# Patient Record
Sex: Male | Born: 1975 | Race: White | Hispanic: No | Marital: Single | Smoking: Former smoker
Health system: Southern US, Community
[De-identification: ages and names within clinical notes are randomized; demographics above are authoritative.]

## PROBLEM LIST (undated history)

## (undated) VITALS — BP 128/87 | HR 76 | Temp 98.6°F | Resp 17

## (undated) DIAGNOSIS — E785 Hyperlipidemia, unspecified: Secondary | ICD-10-CM

## (undated) DIAGNOSIS — K219 Gastro-esophageal reflux disease without esophagitis: Secondary | ICD-10-CM

## (undated) DIAGNOSIS — F319 Bipolar disorder, unspecified: Secondary | ICD-10-CM

## (undated) HISTORY — DX: Gastro-esophageal reflux disease without esophagitis: K21.9

## (undated) HISTORY — DX: Hyperlipidemia, unspecified: E78.5

## (undated) HISTORY — PX: KNEE SURGERY: SHX244

---

## 1999-03-19 ENCOUNTER — Ambulatory Visit (HOSPITAL_COMMUNITY): Admission: RE | Admit: 1999-03-19 | Discharge: 1999-03-19 | Payer: Self-pay | Admitting: Psychiatry

## 1999-06-12 ENCOUNTER — Emergency Department (HOSPITAL_COMMUNITY): Admission: EM | Admit: 1999-06-12 | Discharge: 1999-06-12 | Payer: Self-pay | Admitting: Emergency Medicine

## 1999-07-18 ENCOUNTER — Ambulatory Visit (HOSPITAL_COMMUNITY): Admission: RE | Admit: 1999-07-18 | Discharge: 1999-07-18 | Payer: Self-pay | Admitting: Psychiatry

## 1999-08-21 ENCOUNTER — Ambulatory Visit (HOSPITAL_COMMUNITY): Admission: RE | Admit: 1999-08-21 | Discharge: 1999-08-21 | Payer: Self-pay | Admitting: Psychiatry

## 2000-12-29 ENCOUNTER — Inpatient Hospital Stay (HOSPITAL_COMMUNITY): Admission: AD | Admit: 2000-12-29 | Discharge: 2001-01-06 | Payer: Self-pay | Admitting: *Deleted

## 2001-01-07 ENCOUNTER — Other Ambulatory Visit (HOSPITAL_COMMUNITY): Admission: RE | Admit: 2001-01-07 | Discharge: 2001-01-21 | Payer: Self-pay | Admitting: Psychiatry

## 2001-09-01 ENCOUNTER — Encounter: Admission: RE | Admit: 2001-09-01 | Discharge: 2001-09-01 | Payer: Self-pay | Admitting: Psychiatry

## 2001-11-09 ENCOUNTER — Encounter: Admission: RE | Admit: 2001-11-09 | Discharge: 2001-11-09 | Payer: Self-pay | Admitting: Psychiatry

## 2002-02-08 ENCOUNTER — Encounter: Admission: RE | Admit: 2002-02-08 | Discharge: 2002-02-08 | Payer: Self-pay | Admitting: Psychiatry

## 2002-05-20 ENCOUNTER — Encounter: Admission: RE | Admit: 2002-05-20 | Discharge: 2002-05-20 | Payer: Self-pay | Admitting: Psychiatry

## 2002-07-02 ENCOUNTER — Encounter (INDEPENDENT_AMBULATORY_CARE_PROVIDER_SITE_OTHER): Payer: Self-pay | Admitting: *Deleted

## 2002-07-02 ENCOUNTER — Ambulatory Visit (HOSPITAL_BASED_OUTPATIENT_CLINIC_OR_DEPARTMENT_OTHER): Admission: RE | Admit: 2002-07-02 | Discharge: 2002-07-02 | Payer: Self-pay | Admitting: General Surgery

## 2002-08-10 ENCOUNTER — Encounter: Admission: RE | Admit: 2002-08-10 | Discharge: 2002-08-10 | Payer: Self-pay | Admitting: Psychiatry

## 2002-11-10 ENCOUNTER — Encounter: Admission: RE | Admit: 2002-11-10 | Discharge: 2002-11-10 | Payer: Self-pay | Admitting: Psychiatry

## 2003-02-09 ENCOUNTER — Encounter: Admission: RE | Admit: 2003-02-09 | Discharge: 2003-02-09 | Payer: Self-pay | Admitting: Psychiatry

## 2004-07-02 ENCOUNTER — Inpatient Hospital Stay (HOSPITAL_COMMUNITY): Admission: RE | Admit: 2004-07-02 | Discharge: 2004-07-09 | Payer: Self-pay | Admitting: Psychiatry

## 2004-07-02 ENCOUNTER — Ambulatory Visit: Payer: Self-pay | Admitting: Psychiatry

## 2004-07-10 ENCOUNTER — Other Ambulatory Visit (HOSPITAL_COMMUNITY): Admission: RE | Admit: 2004-07-10 | Discharge: 2004-08-03 | Payer: Self-pay | Admitting: Psychiatry

## 2004-08-03 ENCOUNTER — Ambulatory Visit (HOSPITAL_COMMUNITY): Payer: Self-pay | Admitting: Psychiatry

## 2004-10-31 ENCOUNTER — Ambulatory Visit (HOSPITAL_COMMUNITY): Payer: Self-pay | Admitting: Psychiatry

## 2005-01-28 ENCOUNTER — Ambulatory Visit (HOSPITAL_COMMUNITY): Payer: Self-pay | Admitting: Psychiatry

## 2005-03-07 ENCOUNTER — Ambulatory Visit: Payer: Self-pay | Admitting: Psychiatry

## 2005-03-07 ENCOUNTER — Inpatient Hospital Stay (HOSPITAL_COMMUNITY): Admission: RE | Admit: 2005-03-07 | Discharge: 2005-03-13 | Payer: Self-pay | Admitting: Psychiatry

## 2005-03-19 ENCOUNTER — Ambulatory Visit (HOSPITAL_COMMUNITY): Payer: Self-pay | Admitting: Psychiatry

## 2005-04-22 ENCOUNTER — Ambulatory Visit (HOSPITAL_COMMUNITY): Payer: Self-pay | Admitting: Psychiatry

## 2005-05-14 ENCOUNTER — Ambulatory Visit: Payer: Self-pay | Admitting: Psychiatry

## 2005-05-14 ENCOUNTER — Inpatient Hospital Stay (HOSPITAL_COMMUNITY): Admission: RE | Admit: 2005-05-14 | Discharge: 2005-05-23 | Payer: Self-pay | Admitting: Psychiatry

## 2005-05-29 ENCOUNTER — Ambulatory Visit (HOSPITAL_COMMUNITY): Payer: Self-pay | Admitting: Psychiatry

## 2005-07-01 ENCOUNTER — Ambulatory Visit (HOSPITAL_COMMUNITY): Payer: Self-pay | Admitting: Psychiatry

## 2005-07-29 ENCOUNTER — Ambulatory Visit (HOSPITAL_COMMUNITY): Payer: Self-pay | Admitting: Psychiatry

## 2005-09-16 ENCOUNTER — Ambulatory Visit (HOSPITAL_COMMUNITY): Payer: Self-pay | Admitting: Psychiatry

## 2005-09-30 ENCOUNTER — Ambulatory Visit (HOSPITAL_COMMUNITY): Payer: Self-pay | Admitting: Psychiatry

## 2005-10-23 ENCOUNTER — Ambulatory Visit (HOSPITAL_COMMUNITY): Payer: Self-pay | Admitting: Psychiatry

## 2005-12-04 ENCOUNTER — Ambulatory Visit (HOSPITAL_COMMUNITY): Payer: Self-pay | Admitting: Psychiatry

## 2006-01-13 ENCOUNTER — Ambulatory Visit (HOSPITAL_COMMUNITY): Payer: Self-pay | Admitting: Psychiatry

## 2006-02-12 ENCOUNTER — Ambulatory Visit (HOSPITAL_COMMUNITY): Payer: Self-pay | Admitting: Psychiatry

## 2006-03-12 ENCOUNTER — Ambulatory Visit (HOSPITAL_COMMUNITY): Payer: Self-pay | Admitting: Psychiatry

## 2006-05-09 ENCOUNTER — Ambulatory Visit (HOSPITAL_COMMUNITY): Payer: Self-pay | Admitting: Psychiatry

## 2006-05-21 ENCOUNTER — Ambulatory Visit (HOSPITAL_COMMUNITY): Admission: RE | Admit: 2006-05-21 | Discharge: 2006-05-21 | Payer: Self-pay | Admitting: Psychiatry

## 2006-07-14 ENCOUNTER — Ambulatory Visit (HOSPITAL_COMMUNITY): Payer: Self-pay | Admitting: Psychiatry

## 2006-08-24 ENCOUNTER — Emergency Department (HOSPITAL_COMMUNITY): Admission: EM | Admit: 2006-08-24 | Discharge: 2006-08-24 | Payer: Self-pay | Admitting: *Deleted

## 2006-09-17 ENCOUNTER — Ambulatory Visit (HOSPITAL_COMMUNITY): Payer: Self-pay | Admitting: Psychiatry

## 2006-10-15 ENCOUNTER — Ambulatory Visit (HOSPITAL_COMMUNITY): Payer: Self-pay | Admitting: Psychiatry

## 2006-12-16 ENCOUNTER — Ambulatory Visit (HOSPITAL_COMMUNITY): Payer: Self-pay | Admitting: Psychiatry

## 2007-02-17 ENCOUNTER — Ambulatory Visit (HOSPITAL_COMMUNITY): Payer: Self-pay | Admitting: Psychiatry

## 2007-02-19 ENCOUNTER — Emergency Department (HOSPITAL_COMMUNITY): Admission: EM | Admit: 2007-02-19 | Discharge: 2007-02-19 | Payer: Self-pay | Admitting: Emergency Medicine

## 2007-04-14 ENCOUNTER — Ambulatory Visit (HOSPITAL_COMMUNITY): Payer: Self-pay | Admitting: Psychiatry

## 2007-04-20 ENCOUNTER — Ambulatory Visit (HOSPITAL_COMMUNITY): Admission: RE | Admit: 2007-04-20 | Discharge: 2007-04-20 | Payer: Self-pay | Admitting: Physician Assistant

## 2007-06-08 ENCOUNTER — Ambulatory Visit (HOSPITAL_COMMUNITY): Payer: Self-pay | Admitting: Psychiatry

## 2007-07-10 ENCOUNTER — Encounter: Admission: RE | Admit: 2007-07-10 | Discharge: 2007-07-10 | Payer: Self-pay | Admitting: Internal Medicine

## 2007-08-12 ENCOUNTER — Ambulatory Visit (HOSPITAL_COMMUNITY): Payer: Self-pay | Admitting: Psychiatry

## 2007-08-28 ENCOUNTER — Encounter (INDEPENDENT_AMBULATORY_CARE_PROVIDER_SITE_OTHER): Payer: Self-pay | Admitting: *Deleted

## 2007-08-28 ENCOUNTER — Ambulatory Visit (HOSPITAL_COMMUNITY): Admission: RE | Admit: 2007-08-28 | Discharge: 2007-08-28 | Payer: Self-pay | Admitting: *Deleted

## 2007-10-15 ENCOUNTER — Ambulatory Visit (HOSPITAL_COMMUNITY): Payer: Self-pay | Admitting: Psychiatry

## 2007-12-17 ENCOUNTER — Ambulatory Visit (HOSPITAL_COMMUNITY): Payer: Self-pay | Admitting: Psychiatry

## 2008-02-11 ENCOUNTER — Ambulatory Visit (HOSPITAL_COMMUNITY): Payer: Self-pay | Admitting: Psychiatry

## 2008-04-07 ENCOUNTER — Ambulatory Visit (HOSPITAL_COMMUNITY): Payer: Self-pay | Admitting: Psychiatry

## 2008-05-12 ENCOUNTER — Ambulatory Visit (HOSPITAL_COMMUNITY): Payer: Self-pay | Admitting: Psychiatry

## 2008-07-15 ENCOUNTER — Ambulatory Visit (HOSPITAL_COMMUNITY): Payer: Self-pay | Admitting: Psychiatry

## 2008-09-16 ENCOUNTER — Ambulatory Visit (HOSPITAL_COMMUNITY): Payer: Self-pay | Admitting: Psychiatry

## 2008-11-11 ENCOUNTER — Ambulatory Visit (HOSPITAL_COMMUNITY): Admission: RE | Admit: 2008-11-11 | Discharge: 2008-11-11 | Payer: Self-pay | Admitting: *Deleted

## 2008-11-11 ENCOUNTER — Encounter (INDEPENDENT_AMBULATORY_CARE_PROVIDER_SITE_OTHER): Payer: Self-pay | Admitting: *Deleted

## 2008-11-15 ENCOUNTER — Ambulatory Visit (HOSPITAL_COMMUNITY): Payer: Self-pay | Admitting: Psychiatry

## 2009-02-01 ENCOUNTER — Ambulatory Visit (HOSPITAL_COMMUNITY): Payer: Self-pay | Admitting: Psychiatry

## 2009-03-09 ENCOUNTER — Ambulatory Visit (HOSPITAL_COMMUNITY): Payer: Self-pay | Admitting: Psychiatry

## 2009-05-10 ENCOUNTER — Ambulatory Visit (HOSPITAL_COMMUNITY): Payer: Self-pay | Admitting: Psychiatry

## 2009-05-17 ENCOUNTER — Ambulatory Visit (HOSPITAL_COMMUNITY): Payer: Self-pay | Admitting: Psychiatry

## 2009-05-23 ENCOUNTER — Ambulatory Visit (HOSPITAL_COMMUNITY): Payer: Self-pay | Admitting: Psychiatry

## 2009-05-31 ENCOUNTER — Ambulatory Visit (HOSPITAL_COMMUNITY): Payer: Self-pay | Admitting: Psychiatry

## 2009-06-15 ENCOUNTER — Encounter: Admission: RE | Admit: 2009-06-15 | Discharge: 2009-06-15 | Payer: Self-pay | Admitting: Internal Medicine

## 2009-06-27 ENCOUNTER — Ambulatory Visit (HOSPITAL_COMMUNITY): Payer: Self-pay | Admitting: Psychiatry

## 2009-08-02 ENCOUNTER — Ambulatory Visit (HOSPITAL_COMMUNITY): Payer: Self-pay | Admitting: Psychiatry

## 2009-08-15 ENCOUNTER — Ambulatory Visit (HOSPITAL_COMMUNITY): Payer: Self-pay | Admitting: Psychiatry

## 2009-08-28 ENCOUNTER — Encounter (INDEPENDENT_AMBULATORY_CARE_PROVIDER_SITE_OTHER): Payer: Self-pay | Admitting: Orthopedic Surgery

## 2009-08-28 ENCOUNTER — Ambulatory Visit: Admission: RE | Admit: 2009-08-28 | Discharge: 2009-08-28 | Payer: Self-pay | Admitting: Orthopedic Surgery

## 2009-08-28 ENCOUNTER — Ambulatory Visit: Payer: Self-pay | Admitting: Vascular Surgery

## 2009-09-12 ENCOUNTER — Ambulatory Visit (HOSPITAL_COMMUNITY): Payer: Self-pay | Admitting: Psychiatry

## 2009-09-15 ENCOUNTER — Encounter: Admission: RE | Admit: 2009-09-15 | Discharge: 2009-09-15 | Payer: Self-pay | Admitting: Internal Medicine

## 2009-09-20 ENCOUNTER — Ambulatory Visit (HOSPITAL_COMMUNITY): Payer: Self-pay | Admitting: Psychiatry

## 2009-10-04 ENCOUNTER — Ambulatory Visit (HOSPITAL_COMMUNITY): Payer: Self-pay | Admitting: Psychiatry

## 2009-10-31 ENCOUNTER — Ambulatory Visit (HOSPITAL_COMMUNITY): Payer: Self-pay | Admitting: Psychiatry

## 2009-11-29 ENCOUNTER — Ambulatory Visit (HOSPITAL_COMMUNITY): Payer: Self-pay | Admitting: Psychiatry

## 2010-01-10 ENCOUNTER — Ambulatory Visit (HOSPITAL_COMMUNITY): Payer: Self-pay | Admitting: Psychiatry

## 2010-04-10 ENCOUNTER — Ambulatory Visit (HOSPITAL_COMMUNITY): Payer: Self-pay | Admitting: Psychiatry

## 2010-04-16 ENCOUNTER — Emergency Department (HOSPITAL_COMMUNITY): Admission: EM | Admit: 2010-04-16 | Discharge: 2010-04-16 | Payer: Self-pay | Admitting: Emergency Medicine

## 2010-05-02 ENCOUNTER — Ambulatory Visit (HOSPITAL_COMMUNITY): Payer: Self-pay | Admitting: Psychiatry

## 2010-05-18 ENCOUNTER — Ambulatory Visit (HOSPITAL_COMMUNITY): Payer: Self-pay | Admitting: Psychiatry

## 2010-06-08 ENCOUNTER — Ambulatory Visit (HOSPITAL_COMMUNITY): Payer: Self-pay | Admitting: Psychiatry

## 2010-07-24 ENCOUNTER — Ambulatory Visit (HOSPITAL_COMMUNITY): Payer: Self-pay | Admitting: Psychiatry

## 2010-09-25 ENCOUNTER — Ambulatory Visit (HOSPITAL_COMMUNITY): Payer: Self-pay | Admitting: Psychiatry

## 2010-12-07 ENCOUNTER — Encounter (HOSPITAL_COMMUNITY): Payer: MEDICARE | Admitting: Physician Assistant

## 2010-12-07 ENCOUNTER — Ambulatory Visit (HOSPITAL_COMMUNITY): Admit: 2010-12-07 | Payer: Self-pay | Admitting: Psychiatry

## 2010-12-07 DIAGNOSIS — F319 Bipolar disorder, unspecified: Secondary | ICD-10-CM

## 2011-01-21 LAB — RAPID URINE DRUG SCREEN, HOSP PERFORMED
Amphetamines: NOT DETECTED
Opiates: NOT DETECTED

## 2011-01-21 LAB — URINALYSIS, ROUTINE W REFLEX MICROSCOPIC
Bilirubin Urine: NEGATIVE
Protein, ur: NEGATIVE mg/dL
Specific Gravity, Urine: 1.005 (ref 1.005–1.030)
pH: 5.5 (ref 5.0–8.0)

## 2011-01-21 LAB — DIFFERENTIAL
Basophils Absolute: 0.1 10*3/uL (ref 0.0–0.1)
Basophils Relative: 2 % — ABNORMAL HIGH (ref 0–1)
Eosinophils Absolute: 0.2 10*3/uL (ref 0.0–0.7)
Eosinophils Relative: 3 % (ref 0–5)
Lymphocytes Relative: 26 % (ref 12–46)
Lymphs Abs: 1.9 10*3/uL (ref 0.7–4.0)
Monocytes Relative: 4 % (ref 3–12)
Neutrophils Relative %: 66 % (ref 43–77)

## 2011-01-21 LAB — CBC
Hemoglobin: 14.4 g/dL (ref 13.0–17.0)
MCHC: 33.5 g/dL (ref 30.0–36.0)
MCV: 88.8 fL (ref 78.0–100.0)
RBC: 4.82 MIL/uL (ref 4.22–5.81)
RDW: 14 % (ref 11.5–15.5)

## 2011-01-21 LAB — BASIC METABOLIC PANEL
Calcium: 9.5 mg/dL (ref 8.4–10.5)
GFR calc Af Amer: >60 mL/min (ref >60)
Glucose, Bld: 121 mg/dL — ABNORMAL HIGH (ref 70–99)
Potassium: 3.8 mEq/L (ref 3.5–5.1)
Sodium: 139 mEq/L (ref 135–145)

## 2011-01-21 LAB — ETHANOL: Alcohol, Ethyl (B): <5 mg/dL (ref 0–10)

## 2011-01-21 LAB — ACETAMINOPHEN LEVEL: Acetaminophen (Tylenol), Serum: <10 ug/mL — ABNORMAL LOW (ref 10–30)

## 2011-02-06 ENCOUNTER — Encounter (HOSPITAL_COMMUNITY): Payer: Medicare Other | Admitting: Physician Assistant

## 2011-02-06 DIAGNOSIS — F319 Bipolar disorder, unspecified: Secondary | ICD-10-CM

## 2011-03-19 NOTE — Op Note (Signed)
NAMENELS, MUNN             ACCOUNT NO.:  [id number]   MEDICAL RECORD NO.:  [id number]          PATIENT TYPE:  AMB   LOCATION:  ENDO                         FACILITY:  [facility]   PHYSICIAN:  Georgiana Spinner, M.D.    DATE OF BIRTH:  [birth year]   DATE OF PROCEDURE:  DATE OF DISCHARGE:                               OPERATIVE REPORT   PROCEDURE:  Upper endoscopy with biopsy.   INDICATIONS:  GERD, rule out Barrett's esophagus.   ANESTHESIA:  Fentanyl 75 mcg, Versed 7.5 mg.   DESCRIPTION OF PROCEDURE:  With the patient mildly sedated in the left  lateral decubitus position, the Pentax videoscopic endoscope was  inserted in the mouth and passed under direct vision through the  esophagus which appeared normal, except there were two areas of possible  Barrett's esophagus.  I could not clearly see the squamocolumnar  junction outline fully, but I elected to biopsy these areas.  Once  accomplished, the endoscope was advanced in the stomach.  The fundus,  body, antrum, duodenal bulb and the second portion of the duodenum were  visualized.  From this point, the endoscope was slowly withdrawn taking  circumferential views of duodenal mucosa until the endoscope had been  pulled back into stomach and placed in retroflexion to view the stomach  from below.  The endoscope was straightened and withdrawn taking  circumferential views of the remaining gastric and esophageal mucosa.  The patient's vital signs and pulse oximeter remained stable.  The  patient tolerated the procedure well without apparent complications.   FINDINGS:  Two possible areas of Barrett's esophagus, rule out normal  variant.  Await biopsy report.  The patient will call me for results and  follow-up with me as an outpatient.           ______________________________  Georgiana Spinner, M.D.     GMO/MEDQ  D:  11/11/2008  T:  11/11/2008  Job:  [id number]

## 2011-03-19 NOTE — Op Note (Signed)
NAMEAMMAR, MOFFATT             ACCOUNT NO.:  [id number]   MEDICAL RECORD NO.:  [id number]          PATIENT TYPE:  AMB   LOCATION:  ENDO                         FACILITY:  [facility]   PHYSICIAN:  Georgiana Spinner, M.D.    DATE OF BIRTH:  27-Oct-1976   DATE OF PROCEDURE:  08/28/2007  DATE OF DISCHARGE:                               OPERATIVE REPORT   PROCEDURE:  Upper endoscopy with biopsy.   ENDOSCOPIST:  Georgiana Spinner, M.D.   INDICATIONS:  Dysphagia with negative barium swallow.   ANESTHESIA:  Fentanyl 75 mcg, Versed 10 mg, Phenergan 25 mg.   PROCEDURE:  With the patient mildly sedated in the left lateral  decubitus position, the Pentax videoscopic endoscope was inserted in the  mouth and passed under direct vision through the esophagus, which  appeared normal, until we reached the distal esophagus, and there  appeared to be changes of Barrett's esophagus and photographed.  We then  entered into the stomach; fundus, body, antrum, duodenal bulb, second  portion duodenum were visualized, although we did not get good  insufflation.  No abnormalities were noted.  From this point, the  endoscope was slowly withdrawn, taking circumferential views of duodenal  mucosa, until the endoscope had been pulled back into the stomach and  placed in retroflexion to view the stomach from below.  The endoscope  was then straightened and withdrawn, taking circumferential views of the  remaining gastric and esophageal mucosa, stopping in the distal  esophagus to biopsy what appeared to be Barrett's esophagus.  The  endoscope was withdrawn.  The patient's vital signs and pulse oximetry  remained stable.  The patient tolerated procedure well without apparent  complications.   FINDINGS:  Question of Barrett's esophagus, biopsied, await biopsy  report.  The patient will call me for results and follow up with me as  an outpatient.           ______________________________  Georgiana Spinner, M.D.     GMO/MEDQ  D:  08/28/2007  T:  08/29/2007  Job:  [id number]

## 2011-03-22 NOTE — Discharge Summary (Signed)
Thomas Woodward, Thomas Woodward             ACCOUNT NO.:  [id number]   MEDICAL RECORD NO.:  [id number]          PATIENT TYPE:  IPS   LOCATION:  0506                          [facility]   PHYSICIAN:  Jeanice Lim, M.D. DATE OF BIRTH:  08/31/1976   DATE OF ADMISSION:  07/02/2004  DATE OF DISCHARGE:  07/09/2004                                 DISCHARGE SUMMARY   IDENTIFYING DATA:  This is a 35 year old, single, Caucasian male voluntarily  admitted with a history of bipolar disorder versus schizoaffective disorder.  Relapsed on and off alcohol a couple of times during the past year.  Currently been drinking alcohol for the last two weeks, restarting work at  [employer].  Feeling nervous and overwhelmed.  Felt the staff was looking at  him and judging him.  Had been taking his brother's Klonopin.  Last drink  was Friday night.  He was drinking and had thoughts about possibly going to  a hotel room and taking all of his pills, which had recently been filled.   PAST PSYCHIATRIC HISTORY:  Three prior psych hospitalizations, the last one  being in February 2002 with a Risperdal overdose, admitted to [facility].  History of several other hospitalizations in 1994 and 1995.  Two weeks  January 2002 at [facility].  History of schizoaffective disorder again.  Treated by Carolanne Grumbling, Psychiatrist, with one episode of manic behavior.  Becoming hyperreligious in the past.   SUBSTANCE ABUSE HISTORY:  Noted above.  Relapsed two weeks.  Again, on  alcohol and occasional marijuana use and had been using his brother's  Klonopin.   PHYSICAL EXAMINATION:  Physical and neurologic exams essentially within  normal limits.   LABORATORY DATA:  Routine admission labs essentially within normal limits,  including CMET and CBC.  Urine drug screen negative for THC and  benzodiazepines, but positive for tricyclics.   CURRENT MEDICATIONS:  Lithium and Lexapro.  Previously been on Abilify.   MENTAL STATUS  EXAM:  Fully alert male, guarded, cooperative.  CIWA is  negative.  Speech hesitant, normal tone.  Mood guarded, depressed.  Poor  insight, obsessive thinking.  Some paranoia with perceptions at work,  suicidal thoughts with thoughts of overdosing.  Cognitively intact.  Judgment, insight limited with impulse control guarded.   ADMITTING DIAGNOSES:   AXIS I:  1.  Rule out schizoaffective disorder, bipolar type.  2.  Alcohol dependence.  3.  Benzodiazepine dependence.  4.  Cannabis abuse by history.   AXIS II:  Deferred.   AXIS III:  None.   AXIS IV:  Moderate - Job and social stress.   AXIS V:  30/58 to 60.   HOSPITAL COURSE:  The patient was admitted and ordered routine p.r.n.  medications and underwent further monitoring.  Was encouraged to participate  in individual, group and milieu therapies.  The patient was monitored for  safety and placed on low dose Librium for safe detox with close monitoring.  Hemoglobin A1c, lipid panel and other medical labs, including lithium level  are ordered as indicated and the patient was adjusted on Seroquel.  Abilify  was restarted due to the patient's history of reporting a positive response  to this and Seroquel optimized to restore sleep.  Patient reported positive  response to medication changes.  Was discharged in improved condition.  Mood  was more stable and euthymic.  Affect brighter.  Thought process was goal  directed.  No psychotic symptoms.  No dangerous ideation, reporting  motivation to be compliant with aftercare plan as well as no acute  withdrawal symptoms.  Reporting motivation to be abstinent.   DISCHARGE MEDICATIONS:  The patient was discharged in improved condition,  given medication education and discharged on:  1.  Lithium carbonate at 300 mg four at bedtime.  2.  Abilify 15 mg a day.  3.  Seroquel 150 mg 8:00 p.m.  4.  Trazodone 100 mg q.h.s. p.r.n.  5.  Lexapro 10 mg.  6.  Antabuse 250 mg one half in the morning  for two days and then one in the      morning.  Avoid alcohol-containing products.  The patient was given      information on Antabuse including potential lethal reaction mixing      alcohol and Antabuse.   FOLLOW UP:  The patient was to follow up with IOP program for dual diagnosis  on the day following discharge.   DISCHARGE DIAGNOSES:   AXIS I:  1.  Rule out schizoaffective disorder, bipolar type.  2.  Alcohol dependence.  3.  Benzodiazepine dependence.  4.  Cannabis abuse by history.   AXIS II:  Deferred.   AXIS III:  None.   AXIS IV:  Moderate - Job and social stress.   AXIS V:  Global Assessment of Functioning on discharge was 55.     Jame   JEM/MEDQ  D:  08/30/2004  T:  08/30/2004  Job:  [id number]

## 2011-03-22 NOTE — Discharge Summary (Signed)
[name], Woodward             ACCOUNT NO.:  [id number]   MEDICAL RECORD NO.:  [id number]          PATIENT TYPE:  IPS   LOCATION:  0305                          [facility]ACILITY:  [facility]   PHYSICIAN:  Geoffery Lyons, M.D.      DATE OF BIRTH:  1976-08-14   DATE OF ADMISSION:  03/07/2005  DATE OF DISCHARGE:  03/13/2005                                 DISCHARGE SUMMARY   CHIEF COMPLAINT AND PRESENTING ILLNESS:  This was the second admission to  [facility]  for this 35 year old single white male,  voluntarily admitted.  History as he claims of a nervous breakdown the night  before.  He had been seeing flashlights in the woods, people talking to him,  stating that they are coming to get him.  Reports increased mood swings,  feeling very irritable and impulsive, wondering if he been hearing some  hallucinations.  Claimed that he was on a new level of thinking.  Has been  taking his medications on an infrequent basis and stated that he was  recovering from alcohol and marijuana, with his last use 2 weeks prior to  this admission.   PAST PSYCHIATRIC HISTORY:  Second admission to [facility].  History  of bipolar disorder, prior history of suicide attempts.  Seeing Dr. Carolanne Grumbling on an outpatient basis.   ALCOHOL AND DRUG HISTORY:  Endorsed drinking and some occasional marijuana  use.  Last drink was 2 weeks prior to this admission.   PAST MEDICAL HISTORY:  Noncontributory.   MEDICATIONS:  1.  Lexapro 20 mg per day.  2.  Lithium 1200 mg per day but did admit to noncompliance.   PHYSICAL EXAMINATION:  Performed, failed to show any acute findings.   LABORATORY WORKUP:  Blood chemistries were within normal limits.  TSH 1.394.  Lithium level 0.28.  Drug screen negative for substances of abuse.   MENTAL STATUS EXAM:  Reveals an alert, cooperative male, fair eye contact.  Speech was clear, normal rate, tempo and production.  Mood somewhat anxious,  irritable.  Affect  anxious.  Thought processes logical, coherent and  relevant, some paranoid ideation with questionable hallucinations.  Cognition well preserved.   ADMISSION DIAGNOSES:   AXIS I:  1.  Bipolar disorder with psychotic features.  2.  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   AXIS II:  No diagnosis.   AXIS III:  No diagnosis.   AXIS IV:  Moderate.   AXIS V:  Global assessment of function upon admission 25-30, highest global  assessment of function in past year 60-65.   COURSE IN HOSPITAL:  He was admitted and started on individual and group  psychotherapy.  He was placed back on lithium 300 twice a day that was  increased to 300 in the morning, 600 at bedtime.  He was placed on Risperdal  0.25 in the morning and 0.5 at night that was increased to 0.5 twice a day  and 1 mg at night.  He was given Cogentin 1 mg every 6 hours as needed.  The  Risperdal was later increased to 0.5 twice  a day and 1.5 at bedtime.  Finally, the lithium was increased to 600 mg twice a day.  He did endorse  that he got very upset when he heard the voices and saw the lights.  Also  heard his name being called.  After that he said he had a nervous breakdown,  unable to sleep, more agitated, feeling unsafe, out of control, very upset  with the situation, claimed compliance with medications but he had been  questioned before.  On initial evaluation he was pretty disorganized with  some paranoid ideation, some worry, some concern.  As the medications were  adjusted and he settled down, he started sleeping better.  He was definitely  less anxious and several times expressed the concern of being afraid of  losing control.  He worked on Pharmacologist, Optician, dispensing and anger  management.  Initially stated that he wanted to go to a residential  treatment program as he wanted to be sure that he would not relapse.  He  felt that he could not handle it if he was discharged from the hospital and  back into  his home and outpatient treatment.  He was somewhat more  insightful.  He was able to live with some events from his past, endorsed  having a lot of losses, and he was able to deal with one in group therapy.  Did endorse some worry, lots of concern.  There was a family session with  his parents.  On May 9 he did endorse that he was feeling better and he did  admit to noncompliance with medications.  Also had some alcohol abuse.  In  the session with the parents, he was very anxious, difficulty sitting still.  He endorsed that he was more clear in terms of the need to take medications  and avoid alcohol.  He was not wanting to pursue the residential treatment  program and felt comfortable going home and coming back to the [facility] IOP  program, so on May 10 he was in full contact with reality.  Endorsed he was  feeling better objectively.  Mood was better, his affect was brighter.  Had  worked on relapse prevention, coping skills.  He was more insightful,  willing to comply with treatment, so we went ahead and discharged to  outpatient followup.   DISCHARGE DIAGNOSES:   AXIS I:  1.  Bipolar disorder with psychotic features.  2.  Alcohol and marijuana abuse.   AXIS II:  No diagnosis.   AXIS III:  No diagnosis.   AXIS IV:  Moderate.   AXIS V:  Global assessment of function upon discharge 50-55.   DISCHARGE MEDICATIONS:  1.  Risperdal 1 mg 3 times a day and 2 at bedtime.  2.  Lithium carbonate 300 two twice a day.   DISPOSITION:  Follow up [facility] IOP and Dr. Ladona Ridgel at [facility].       IL/MEDQ  D:  04/03/2005  T:  04/03/2005  Job:  [id number]

## 2011-03-22 NOTE — H&P (Signed)
Thomas Woodward, Thomas Woodward                         ACCOUNT NO.:  [id number]   MEDICAL RECORD NO.:  [id number]                   PATIENT TYPE:  IPS   LOCATION:  0303                                 FACILITY:  [facility]   PHYSICIAN:  Jeanice Lim, M.D.              DATE OF BIRTH:  1976-06-27   DATE OF ADMISSION:  07/02/2004  DATE OF DISCHARGE:                         PSYCHIATRIC ADMISSION ASSESSMENT   DATE OF ASSESSMENT:  July 03, 2004   PATIENT IDENTIFICATION:  This is a 35 year old single white male.  This is a  voluntary admission.   HISTORY OF PRESENT ILLNESS:  This patient with a history of bipolar disorder  and a history of schizoaffective disorder has relapsed on and off on alcohol  a couple of times during the past year.  Currently, he relapsed on alcohol  approximately two weeks ago after restarting work for [employer] where he  was working with the [employer] and he felt that the timing  of the different tasks in his job had become overwhelming for him.  It made  him nervous.  He also had worked there before and he felt that the staff  were looking at him and judging him about why he had come back to work and  why he had left in the first place.  He had felt overwhelmed about the job,  started drinking again, and was also taking two to three tablets of his  brother's Klonopin 1 mg two to three times a week.  He was drinking to get  drunk, drinking most every day approximately seven to eight beers.  His last  drink was last Friday night.  He became frightened because he had been at a  bar and he remembered the police bringing him home but had memory loss about  some of the events that night.  Because he was drinking he had thoughts  about maybe going to a hotel room, drinking, and taking all of his pills,  which had recently been refilled.  This scared him and he felt that it would  be better to come and request hospitalization for his depression and  anxiety.  He  denies any homicidal thought or auditory or visual  hallucinations.  He admits to having quite a bit of anxiety related to his  job and that this was his primary trigger for relapse.  He was not clear  about his sleep pattern or his appetite and generally seems unable to  describe his various symptoms very clearly.   PAST PSYCHIATRIC HISTORY:  The patient has a history of prior psychiatric  admissions with his last one being in late February 2002 after taking a  Risperdal overdose.  He was admitted here at [facility].  He also has a history of several other hospitalizations in 1994 and 1995 at  [facility] and two weeks in January 2002 at [facility].  The patient  has a history of schizoaffective disorder bipolar type with primarily  depressive episodes.  He is able to describe one episode of manic behavior  when he became very hyperreligious.  He is currently followed since age 73  by Carolanne Grumbling, M.D., in the [facility].  Past medications include Abilify, which was discontinued because it was not  helping; Seroquel made him too drowsy; Risperdal had helped, he had not been  on this for quite some time since he had overdosed on it; and he has also  previously been on Zyprexa.   SUBSTANCE ABUSE HISTORY:  Alcohol and drug history is noted above.  The  patient relapsed about two weeks ago on alcohol with his last drink this  past Friday.  He also endorses occasional use of marijuana and use of his  brother's Klonopin as previously noted.   PAST MEDICAL HISTORY:  The patient has been followed in the past by Dr.  Lendell Caprice who is his primary care physician.  Current medical problems: The  patient denies any current medical problems.  Past medical history is  remarkable for a history of eye surgery at age 43 for muscle weakness.  He  wears corrective lenses, which are currently broken.  He has had a ganglion  cyst removed x 3, history of  childhood tonsillectomy.  No history of  seizures.  Some memory loss following the previous drinking episode last  weekend.  He does not take any medications for medical reasons.  He denies  any prior hospitalizations.   CURRENT MEDICATIONS:  1.  Lithium 1200 mg.  He takes all of his lithium at bedtime.  2.  Lexapro 10 mg p.o. q.a.m.   He was previously on Abilify, which he says his psychiatrist recently  discontinued.   PHYSICAL EXAMINATION:  GENERAL:  The patient's full physical examination was  done in the emergency room by [facility] L. Effie Shy, M.D., and was essentially  negative.  On admission to the unit, the patient is 6 feet tall, 208 pounds.  Hygiene is adequate.  Dress and grooming are appropriate.  Motor exam  appears grossly normal.  VITAL SIGNS:  Temperature 98, heart rate 52, respirations 18, blood pressure  117/75.   LABORATORY DATA:  Lithium level 0.57.  Salicylate and acetaminophen levels  are negative.  CBC is within normal limits.  Urine drug screen is negative  for any THC and negative for nay benzodiazepines but positive for tricyclics  which the patient is unable to explain what if any tricyclics he may have  taken.  He is not aware of taking any.  Urinalysis is negative.  His fasting  glucose on CMET is 106; otherwise, CMET is within normal limits.   SOCIAL HISTORY:  The patient has a 10th grade education, has four half  sisters, one half brother, one full biological brother, and one full  biological sister.  He had previously been living at [facility] and when  relapses on alcohol, he goes back to living with his parents.  He describes  some family stress with parental arguing and some dysfunctional  relationships.  He is on an unsupervised probation for previous DUI and he  does not drive.  He is currently on disability because of his bipolar  disorder and has [employer].  He has never married, no children.  He had plans to attend vocational  rehabilitation but that cancelled in favor of  him coming in here for treatment.   FAMILY HISTORY:  Family history is remarkable for a brother with a history  of bipolar disorder, family history of schizoaffective disorder in a  maternal uncle who committed suicide.   MENTAL STATUS EXAM:  This is a fully alert male with a guarded affect.  He  is cooperative.  Hygiene is adequate.  Grooming: Appropriate.  He is  cooperative.  His CIWA was negative at the time of admission is negative  now.  Speech is hesitant with normal tone and pace once he gets started.  Mood is guarded and depressed.  Thought process reveals poor insight, some  obsessive thinking about what people think about him, some clear paranoia in  relation to his perceptions at work; positive for suicidal thoughts with  thoughts of overdosing on medications.  His thinking is a bit inappropriate.  Cognitive: Intact to person, place, and day of the week and time frames.  Intellect is within normal limits.  Insight: Poor.  Impulse control and  judgment: Guarded.   ADMISSION DIAGNOSES:   AXIS I:  1.  Rule out schizoaffective disorder, bipolar type, depressed.  2.  Alcohol abuse, rule out dependence.  3.  Benzodiazepine abuse.  4.  Cannabis abuse by history.   AXIS II:  Deferred.   AXIS III:  No diagnosis.   AXIS IV:  Moderate job and social stressors.   AXIS V:  Current 20, past year 31.   INITIAL PLAN OF CARE:  Plan is to voluntarily admit the patient with q.32m.  checks in place with a goal of a safe detoxification from alcohol and  benzodiazepines.  We will ask the case worker to talk with the family about  their concerns.  Meanwhile, we will check a hemoglobin A1c and a lipid  panel.  Because of his paranoia and his inappropriate thinking, we are going  to add some Zyprexa 5 mg p.o. q.h.s. and place him on a Librium protocol.  He will be assigned to dual diagnosis programming and has been interacting  appropriately  on the unit.  We will also be contacting Carolanne Grumbling, M.D.,  his primary psychiatrist.   ESTIMATED LENGTH OF STAY:  Six days.     Margaret A. Stephannie Peters                   Jeanice Lim, M.D.    MAS/MEDQ  D:  07/03/2004  T:  07/03/2004  Job:  [id number]

## 2011-03-22 NOTE — Discharge Summary (Signed)
Thomas Woodward, Thomas Woodward             ACCOUNT NO.:  [id number]   MEDICAL RECORD NO.:  [id number]          PATIENT TYPE:  IPS   LOCATION:  0306                          FACILITY:  [facility]   PHYSICIAN:  Geoffery Lyons, M.D.      DATE OF BIRTH:  29-Nov-1975   DATE OF ADMISSION:  05/14/2005  DATE OF DISCHARGE:  05/23/2005                                 DISCHARGE SUMMARY   CHIEF COMPLAINT AND PRESENTING ILLNESS:  This was one of several admissions  to [facility]  for this 35 year old single white male,  voluntarily admitted.  History of bipolar disorder, reports 6 weeks of  increased depression about state of his life, feeling his life was going  nowhere, unable to get a meaningful job, no future, uncomfortable in social  relationships, sleeping well at night, was reclusive, staying in bed until  noon each day.  No planned activities, thinking he is ready to die.  Denies  hallucinations and delusions, history of all in the past.   PAST PSYCHIATRIC HISTORY:  Third or fourth time at [facility].  Sees Dr. Lolly Mustache and Merlyn Albert May, has done well on lithium and Risperdal, got  delusional on Lexapro.   ALCOHOL AND DRUG HISTORY:  Denies any active use at this moment, but had  thoughts of relapsing on alcohol.   PAST MEDICAL HISTORY:  Noncontributory.   MEDICATIONS:  Lithium 600 in the morning and 600 at night, Risperdal 4 mg at  night, trazodone 50 at night as needed.   PHYSICAL EXAMINATION:  Performed, failed to show any acute findings.   LABORATORY WORKUP:  CBC:  White blood cells 11.2, hemoglobin 14.5.  Blood  chemistries:  Glucose 147, repeated 103.  Liver enzymes SGOT 17, SGPT 22.  Hemoglobin A1c 5.5.  TSH 1.870.  Lithium level 0.77.   MENTAL STATUS EXAM:  Reveals an alert, cooperative male, blunted affect,  some tearfulness, psychomotor retardation, cooperative.  Speech slow paced,  normal tone.  Mood depressed, affect depressed, thought processes positive  for  suicidal ideation without a plan, hopeless, helpless, wanting to give  up, sees no reason why to continue going on.  Cognition well preserved.   ADMISSION DIAGNOSES:  AXIS I:  Bipolar disorder, depressed, alcohol abuse in  remission.  AXIS II:  No diagnosis.  AXIS III:  No diagnosis.  AXIS IV:  Moderate.  AXIS V:  Upon admission 35, highest global assessment of functioning in the  last year 60-65.   COURSE IN HOSPITAL:  He was admitted and started in individual and group  psychotherapy.  He was given trazodone 50 at night.  He was maintained on  Lithium 600 twice a day and Risperdal 4 mg at night.  He was given Ambien  for sleep.  Eventually Ambien and the Ativan initially ordered were  discontinued and he was started on Wellbutrin XL 150 in the morning.  Trazodone did not work for him so we went back to Ambien 10 at night.  As  lithium level was subtherapeutic, lithium was increased to 600 in the  morning, 300 in the afternoon, and  600 at night.  Risperdal was decreased to  3 mg at night and he was given Cogentin 1 mg twice a day.  Risperdal was  later decreased to 2 mg at night.  He endorsed increased signs and symptoms  of depression, denies he has relapsed.  He was staying with the situation,  worried that his parents were getting older, he is not having a life, he  does not have his own place, relationships, feeling overwhelmed, wanting to  die.  We continued to work with the medications, with coping skills, sense  of hopelessness and helplessness.  He stays in the house, isolates, avoids  interactions.  Has been thinking about killing himself.  He talks to his  brother and to his parents.  Endorses that he will eventually kill himself  if he continues to stay like this.  Continued to endorse depression.  Sleep  was erratic, worried about what to do, a sense of hopelessness and  helplessness.  He was still able to contract for safety, evidencing some  psychomotor retardation.   Thoughts positive for wanting to die.  There was a  family session with the mother and the father.  He was going to [facility].  He was going to create some sort of a structure.  On July 17, it  seemed that he started turning around.  He was experiencing what seemed to  be akathisia so we started to try to modify the Risperdal.  Once we  increased the lithium, lithium level was 1.01, Risperdal was decreased to 3  mg and then to 2.  We were able to decrease it successfully.  Sleep was an  issue.  We went up to Wellbutrin XL 300 mg per day.  There was no evidence  of panic or mania.  On July 20, he endorsed he was better, endorsed no  suicidal ideas, no homicidal ideas, no hallucinations, no delusions, was  tolerating the medication well.  It was clear that he had to change his  lifestyle, had to get involved, avoid his isolation.  Overall, he was  better.  He was insightful, willing and motivated to pursue further  outpatient treatment, so he was discharged to outpatient followup.   DISCHARGE DIAGNOSES:  AXIS I:  Bipolar disorder, depressed, alcohol abuse in  remission.  AXIS II:  No diagnosis.  AXIS III:  No diagnosis.  AXIS IV:  Moderate.  AXIS V:  Upon discharge 50.   DISCHARGE MEDICATIONS:  1.  Cogentin 1 mg in the morning and at night.  2.  Risperdal 2 mg at night.  3.  Lithium 300 two in the morning and three at bedtime.  4.  Wellbutrin XL 300 mg in the morning.  5.  Ambien 10 at night for sleep.   DISPOSITION:  Follow up with Dr. Tonny Bollman May in [facility].      Geoffery Lyons, M.D.  Electronically Signed     IL/MEDQ  D:  06/19/2005  T:  06/20/2005  Job:  [id number]

## 2011-03-22 NOTE — Op Note (Signed)
   [name], [name]                       ACCOUNT NO.:  [id number]   MEDICAL RECORD NO.:  [id number]                   PATIENT TYPE:  AMB   LOCATION:  DSC                                  FACILITY:  [facility]   PHYSICIAN:  Sharlet Salina T. Hoxworth, M.D.          DATE OF BIRTH:  Mar 28, 1976   DATE OF PROCEDURE:  07/02/2002  DATE OF DISCHARGE:                                 OPERATIVE REPORT   PREOPERATIVE DIAGNOSES:  Pilonidal cyst and sinus.   POSTOPERATIVE DIAGNOSES:  Pilonidal cyst and sinus.   PROCEDURE:  Excision of pilonidal cyst and sinus.   SURGEON:  Lorne Skeens. Hoxworth, M.D.   ANESTHESIA:  General endotracheal.   BRIEF HISTORY:  Thomas Woodward is a 35 year old white male with a one year  history of intermittent swelling, discomfort and purulent drainage from his  sacrococcygeal area. He presented to the office and exam reveals fairly  extensive pilonidal cyst and sinus with typical midline pits over the coccyx  and a chronically draining sinus tract more superiorly. After discussion  with the patient and with his parents, we have elected to proceed with  excision. The nature of the procedure, its indications and risks of  bleeding, infection and delayed wound healing were discussed and understood.  Plans were discussed to leave the wound open due to the chronic infection  and extent of the problem. He is now brought to the operating room for this  procedure.   DESCRIPTION OF PROCEDURE:  The patient was brought to the operating room and  general endotracheal anesthesia was induced on the stretcher and he was  carefully rolled and positioned prone. He received preoperative IV  antibiotics. The sacrococcygeal area was shaved and sterilely prepped and  draped. An elliptical excision was planned encompassing the midline pits and  the superior draining sinus tract. An incision was made through the skin and  dissection carried down deeply through the subcutaneous tissue to the  level  of the sacral fascia and the entire cyst and sinus complex removed intact.  The soft tissue was infiltrated with Marcaine with epinephrine. Complete  hemostasis was obtained with cautery. The wound was packed open with moist  saline gauze. Sponge, needle and instrument counts were correct.  Dry  sterile dressings were applied and the patient was taken to the recovery  room in satisfactory condition.                                                Lorne Skeens. Hoxworth, M.D.    Tory Emerald  D:  07/02/2002  T:  07/05/2002  Job:  [id number]
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
## 2011-03-22 NOTE — H&P (Signed)
[facility]  Patient:    CARRY, ORTEZ                   MRN: [id number] Adm. Date:  12/29/00 Attending:  Netta Cedars, M.D. Dictator:   Valinda Hoar, N.P.                   Psychiatric Admission Assessment  IDENTIFYING INFORMATION:  Mr. Matranga is a 35 year old white single male admitted December 29, 2000, on a voluntary basis.  CHIEF COMPLAINT:  "I started to take an overdose, and I took eight to twelve 3 mg Risperdal.  It started out as a suicide attempt."  HISTORY OF PRESENT ILLNESS:  The patient overdosed on eight to twelve Risperdal 3 mg tabs at 1 p.m. yesterday afternoon on December 29, 2000.  He states this was a suicide attempt.  He apparently woke up in the morning, did not have a lot of energy.  He wanted just to sleep and avoid the day.  He stated he wanted to get up in the morning and pray, and it bothered him that he just did not feel like it.  He states he thought about hurting himself about 10-15 minutes, and then started overdosing on the Risperdal.  He became scared and called his psychiatrist, and emergency medical services was called, and he was taken to [facility] Emergency Department for treatment of the overdose.  The patient appears hopeless.  The patient has a hard time articulating how he feels.  He looks depressed, but he says he is more melancholy, but has been worse recently.  Denies suicidal ideation or intent today.  Denies homicidal ideation or intent today.  He has no hallucinations, no paranoia.  He is withdrawn.  He states he averages sleeping 8-10 hours a day; however, he states in addition to the 8-10 hours of sleep, he stays in bed for a total of 12 hours a day.  He states that sometimes he religiously preoccupied, but he feels like he does not pray enough.  The patients appetite apparently has been okay, and he denies any changes in his appetite. The patient states he does have a history of manic  episodes, and he has been diagnosed with a bipolar disorder.  He says he has had more manic episodes than depressed episodes; however, he cannot describe the manic episodes.  PAST PSYCHIATRIC HISTORY:  The patient has had no previous suicide attempts. Dr. Carolanne Grumbling is his psychiatrist at [facility].  He sees him once every one to two weeks.  He last saw him one week ago.  He has had several hospitalization - [facility] in January 2002 for two weeks, [facility] in 1994 and 1995.  This is his first admission to [facility].  PAST MEDICAL HISTORY:  The patients primary care doctor is Dr. Higinio Plan. He last saw him over one year ago.  Medical problems:  He has a nonproductive cough x 1 week.  He states otherwise he is in good health.  OUTPATIENT MEDICATIONS: 1. Risperdal 4-5 mg p.o. h.s. 2. Lithium or Lithobid or Eskalith CR - he apparently combines them since he    has some at home, but basically he is on Lithobid 1200 mg q.d. 3. Celexa 40 mg p.o. q.a.m. 4. Colace 100 mg one p.o. q.a.m.  DRUG ALLERGIES:  No known drug allergies.  SOCIAL HISTORY:  The patient lives with his parents.  He is single, no children.  He completed the 10th grade; states he just could not motivate himself to finish.  He has five sisters, two brothers.  He has applied for social security disability and also Medicaid.  He reports that he does not do much during the day at all.  ALCOHOL AND DRUG HISTORY:  He denies alcohol use.  He states he has a history of alcohol abuse, but he has been sober since June 2000.  Denies substance abuse.  States in the past he has used marijuana and LSD.  He last used in 1995.  He states he is a nonsmoker.  He quit smoking one and one-half years ago.  FAMILY HISTORY:  He has a brother who has a bipolar disorder.  Maternal uncle committed suicide years ago.  PHYSICAL EXAMINATION:  For positive physical findings, please see  physical exam done at [facility] Emergency Department on December 29, 2000.  VITAL SIGNS:  Temperature was 97, pulse 91, respirations 20, blood pressure 135/70.  Weight 194 pounds.  Height 5 feet 11-1/4 inches.  LABORATORY AND X-RAY DATA:  He had an EKG done which was within normal limits.  Salicylate level was below 4.0.  His CBC with diff had a hematocrit slightly decreased at 38.5, platelets increased at 406.  Urinalysis was within normal limits.  _______ was less than 10.0.  Lithium level was 0.90 on December 29, 2000, which was within normal range.  Urine drug screen was negative. I-STAT 8 was within normal limits.  CURRENT MENTAL STATUS EXAM:  A young adult Caucasian male dressed in scrubs. He is cooperative, however, little emotional response from him at all.  He does have good eye contact.  He appears somewhat stiff.  Speech is slow with a low volume.  It is relevant, but he seems to have a prolonged speech latency. Mood appears to be sad.  Affect is extremely flat.  He denies current suicidal ideation and intent, although he did overdose yesterday.  He denies homicidal ideation or intent.  Thought processes:  He is goal directed, denies hallucinations, denies paranoia.  He seems to have trouble articulating and answers to questions might be a little bit guarded.  Possibility of some religious preoccupation.  Cognitive:  He is alert and oriented, of average intelligence.  His cognitive function appears to be intact.  His judgment is poor, insight poor, impulse control poor.  ADMISSION DIAGNOSES: Axis I:    Bipolar disorder, depressed with suicidal ideation. Axis II:   Deferred. Axis III:  Nonproductive cough x 1 week, possibly due to postnasal drip. Axis IV:   Mild related to mental illness. Axis V:    Current Global Assessment of Functioning 35; highest the past year            60.  TREATMENT PLAN AND RECOMMENDATIONS:  Voluntary admission to [facility].  We will check this patient every 15 minutes, maintain his safety.  He is able to contract for safety on the unit.  We will continue  him on his Celexa 40 mg p.o. q.a.m.  Will give him Imodium two tabs p.o. q.6h. for 24 hours p.r.n. for diarrhea.  Restart him on his Lithobid 600 mg p.o. in the morning and 600 mg p.o. h.s.  Colace 100 mg p.o. in the morning.  We are going to add Zyprexa 2.5 mg p.o. h.s. and discontinue his Risperdal for now. Zyprexa 2.5 mg p.o. q.6h. p.r.n. for anxiety.  Also, we are going to do a CMET and  ask for [facility] to fax their physical exam done on December 29, 2000.  The patient is going to attempt to attend groups.  TENTATIVE LENGTH OF STAY:  3 days. DD:  12/30/00 TD:  12/31/00 Job: [id number]

## 2011-04-11 ENCOUNTER — Encounter (HOSPITAL_COMMUNITY): Payer: Medicare Other | Admitting: Physician Assistant

## 2011-04-11 DIAGNOSIS — F319 Bipolar disorder, unspecified: Secondary | ICD-10-CM

## 2011-04-12 ENCOUNTER — Encounter (HOSPITAL_COMMUNITY): Payer: Medicare Other | Admitting: Physician Assistant

## 2011-06-18 ENCOUNTER — Encounter (INDEPENDENT_AMBULATORY_CARE_PROVIDER_SITE_OTHER): Payer: Medicare Other | Admitting: Physician Assistant

## 2011-06-18 DIAGNOSIS — F319 Bipolar disorder, unspecified: Secondary | ICD-10-CM

## 2011-07-24 ENCOUNTER — Encounter (INDEPENDENT_AMBULATORY_CARE_PROVIDER_SITE_OTHER): Payer: 59 | Admitting: Physician Assistant

## 2011-07-24 DIAGNOSIS — F319 Bipolar disorder, unspecified: Secondary | ICD-10-CM

## 2011-08-21 LAB — LITHIUM LEVEL: Lithium Lvl: 0.88

## 2011-09-24 ENCOUNTER — Other Ambulatory Visit (HOSPITAL_COMMUNITY): Payer: Self-pay

## 2011-09-25 ENCOUNTER — Ambulatory Visit (INDEPENDENT_AMBULATORY_CARE_PROVIDER_SITE_OTHER): Payer: 59 | Admitting: Physician Assistant

## 2011-09-25 DIAGNOSIS — F319 Bipolar disorder, unspecified: Secondary | ICD-10-CM

## 2011-09-25 NOTE — Progress Notes (Signed)
   [facility] Follow-up Outpatient Visit  Thomas Woodward [birth year]  Date: 09/25/11    Subjective: Pt reports doing well overall.  Life is about the same.  Brother is still at home and causes occasional problem.  Pt tries to avoid, but gets sucked in sometimes.  Had a relapse on alcohol in late September.  Drank about seven beers, and a bottle of wine.  Picked up a restart chip in AA.  Reports sleep is ok.  Takes about an hour to fall asleep, but sleeps about 7 - 8 hours.  Denies any psychosis.  Some depression and anxiety.  Attributes that to not calling his Sponsor.  There were no vitals filed for this visit.  Mental Status Examination  Appearance: Casual Alert: Yes Attention: good  Cooperative: Yes Eye Contact: Good Speech: Clear and even Psychomotor Activity: Normal Memory/Concentration: Intact Oriented: person, place, time/date and situation Mood: Depressed mildly Affect: Blunted Thought Processes and Associations: Linear Fund of Knowledge: Good Thought Content:  Insight: Fair Judgement: Fair  Diagnosis: Bipolar disorder  Treatment Plan: Continue meds as currently prescribed.  Follow up in 1 - 2 months.  Consider ReVia if alcohol cravings persist.  Dang Mathison, PA

## 2011-11-26 ENCOUNTER — Ambulatory Visit (INDEPENDENT_AMBULATORY_CARE_PROVIDER_SITE_OTHER): Payer: 59 | Admitting: Physician Assistant

## 2011-11-26 DIAGNOSIS — F319 Bipolar disorder, unspecified: Secondary | ICD-10-CM

## 2011-11-26 MED ORDER — MIRTAZAPINE 30 MG PO TABS: 30.0000 mg | ORAL_TABLET | Freq: Every day | ORAL | Status: DC

## 2011-12-23 ENCOUNTER — Other Ambulatory Visit (HOSPITAL_COMMUNITY): Payer: Self-pay | Admitting: Physician Assistant

## 2012-01-19 ENCOUNTER — Other Ambulatory Visit (HOSPITAL_COMMUNITY): Payer: Self-pay | Admitting: Physician Assistant

## 2012-01-19 DIAGNOSIS — F319 Bipolar disorder, unspecified: Secondary | ICD-10-CM

## 2012-01-30 ENCOUNTER — Ambulatory Visit (INDEPENDENT_AMBULATORY_CARE_PROVIDER_SITE_OTHER): Payer: 59 | Admitting: Physician Assistant

## 2012-01-30 DIAGNOSIS — F319 Bipolar disorder, unspecified: Secondary | ICD-10-CM

## 2012-01-30 MED ORDER — HYDROXYZINE HCL 25 MG PO TABS: ORAL_TABLET | ORAL | Status: DC

## 2012-01-30 MED ORDER — BENZTROPINE MESYLATE 1 MG PO TABS: 1.0000 mg | ORAL_TABLET | Freq: Three times a day (TID) | ORAL | Status: DC

## 2012-02-03 NOTE — Progress Notes (Signed)
   [facility] Follow-up Outpatient Visit  Thomas Woodward 09-Oct-1976  Date: 11/26/11   Subjective: Thomas Woodward presents today to follow up on medications prescribed for his bipolar disorder. He feels that all in all he is doing well. He sleeps well on the Remeron, his depression is fairly well managed with the Wellbutrin, and his anxiety seems to be well managed on Zyprexa. He denies any suicidal or homicidal ideation. He denies any auditory or visual hallucinations.  There were no vitals filed for this visit.  Mental Status Examination  Appearance: Casual Alert: Yes Attention: good  Cooperative: Yes Eye Contact: Good Speech: Clear and even Psychomotor Activity: Psychomotor Retardation Memory/Concentration: Intact Oriented: person, place, time/date and situation Mood: Depressed Affect: Blunt and Congruent Thought Processes and Associations: Goal Directed and Logical Fund of Knowledge: Fair Thought Content: Normal Insight: Good Judgement: Good  Diagnosis: Bipolar disorder not otherwise specified  Treatment Plan: We will continue his Zyprexa at 15 mg twice daily, Remeron 30 mg nightly, and Wellbutrin XL 300 mg daily. Will also continue his Cogentin 1 mg twice daily, and he will followup in 2 months.  Angel Weedon, PA-C

## 2012-02-03 NOTE — Progress Notes (Signed)
   [facility] Follow-up Outpatient Visit  Thomas Woodward 11/30/1975  Date: 01/30/2012   Subjective: Thomas Woodward presents today to followup on his medications for bipolar disorder. He continues to report that he is stable. He does report some sensations of needing to move frequently, which he feels may be side effects to the Zyprexa, and wonders if the Cogentin could be increased. He also endorses some difficulty falling to sleep, but once he gets to sleep he stays asleep through the night. He states that it may take an hour or two to fall asleep. He denies any suicidal or homicidal ideation. He denies any auditory or visual hallucinations.  There were no vitals filed for this visit.  Mental Status Examination  Appearance: Casual Alert: Yes Attention: good  Cooperative: Yes Eye Contact: Good Speech: Clear and even Psychomotor Activity: Psychomotor Retardation Memory/Concentration: Intact Oriented: person, place, time/date and situation Mood: Anxious and Depressed Affect: Blunt Thought Processes and Associations: Goal Directed and Linear Fund of Knowledge: Good Thought Content: Normal Insight: Good Judgement: Good  Diagnosis: Bipolar disorder not otherwise specified  Treatment Plan: We will add Vistaril 25 mg one to 2 tablets to be taken at bedtime for sleep, and increased his Cogentin to 1 mg 3 times daily for akathisia. We will continue his Zyprexa 15 mg twice daily Wellbutrin XL 300 mg daily, and possibly decrease or discontinue the Remeron depending on the results from his Vistaril. He will followup in one month.  Brentin Shin, PA-C

## 2012-03-11 ENCOUNTER — Ambulatory Visit (INDEPENDENT_AMBULATORY_CARE_PROVIDER_SITE_OTHER): Payer: 59 | Admitting: Physician Assistant

## 2012-03-11 DIAGNOSIS — F319 Bipolar disorder, unspecified: Secondary | ICD-10-CM

## 2012-03-11 NOTE — Progress Notes (Signed)
   [facility] Behavioral Health Follow-up Outpatient Visit  NICKOLAI RINKS [birth year]  Date: 03/11/2012   Subjective: [name] presents today to followup on his medications prescribed for bipolar disorder. He reports that he stopped taking the Remeron and Vistaril as they had no effect. 2 days ago he had some teeth extracted, and he is now prescribed opiate pain relievers, and reports that he sleeps well with them. Otherwise he reports that he often stays up late and will wait up late in the morning. He continues to [facility] on Mondays and Fridays, and work on Tuesday Wednesday and Thursday he reports that he is currently 7 months sober, but still has occasional cravings. When he does have a craving he tries to think it all away through which is usually sufficient to avoid picking up a drink. He denies any suicidal or homicidal ideation. He denies any auditory or visual hallucinations.  There were no vitals filed for this visit.  Mental Status Examination  Appearance: Well groomed and casually dressed Alert: Yes Attention: good  Cooperative: Yes Eye Contact: Good Speech: Clear and coherent Psychomotor Activity: Normal Memory/Concentration: Intact Oriented: person, place, time/date and situation Mood: Euthymic Affect: Appropriate Thought Processes and Associations: Linear Fund of Knowledge: Good Thought Content: Normal Insight: Good Judgement: Good  Diagnosis: Bipolar disorder  Treatment Plan: We will discontinue the Remeron and Vistaril as they are ineffective. Continue the Zyprexa 15 mg twice daily, Wellbutrin XL 300 mg daily, and Cogentin 1 mg 3 times daily. He will return for followup in 6 weeks. He is encouraged to call if problems arise. At his followup we may consider other medications for insomnia.  Billyjoe Go, PA-C

## 2012-03-26 ENCOUNTER — Other Ambulatory Visit (HOSPITAL_COMMUNITY): Payer: Self-pay | Admitting: *Deleted

## 2012-03-30 ENCOUNTER — Other Ambulatory Visit (HOSPITAL_COMMUNITY): Payer: Self-pay | Admitting: Physician Assistant

## 2012-03-31 MED ORDER — BENZTROPINE MESYLATE 1 MG PO TABS: 1.0000 mg | ORAL_TABLET | Freq: Three times a day (TID) | ORAL | Status: DC

## 2012-04-22 ENCOUNTER — Ambulatory Visit (INDEPENDENT_AMBULATORY_CARE_PROVIDER_SITE_OTHER): Payer: Medicaid Other | Admitting: Physician Assistant

## 2012-04-22 DIAGNOSIS — F319 Bipolar disorder, unspecified: Secondary | ICD-10-CM

## 2012-04-22 NOTE — Progress Notes (Signed)
   [facility] Behavioral Health Follow-up Outpatient Visit  Thomas Woodward 02-12-1976  Date: 04/22/2012   Subjective: Thomas Woodward presents today to followup on his treatment for bipolar disorder. He reports that his mood has been stable. He continues to fair to bed late, around midnight, and must wake up no later than 8 AM today work. He states that long sleep is still difficult, and reports that his brother and his brother's girlfriend are up until early morning hours watching television, and he is able to hear them and light strengthening to his room from where they are. Thomas Woodward continues to work 3 days a week on Tuesdays Wednesdays and Thursdays, and [facility] the [facility] on Mondays and Fridays. He denies any suicidal or homicidal ideation. He denies any auditory or visual hallucinations.  There were no vitals filed for this visit.  Mental Status Examination  Appearance: Well groomed and casually dressed Alert: Yes Attention: good  Cooperative: Yes Eye Contact: Good Speech: Clear and coherent Psychomotor Activity: Psychomotor Retardation Memory/Concentration: Intact Oriented: person, place, time/date and situation Mood: Dysphoric Affect: Blunt Thought Processes and Associations: Goal Directed and Linear Fund of Knowledge: Good Thought Content: Normal Insight: Good Judgement: Good  Diagnosis:  Bipolar disorder  Treatment Plan: We will continue all of his medications as follows: Zyprexa 15 mg twice daily, Wellbutrin XL 300 mg daily, and Cogentin 1 mg 3 times daily. He wishes not to add another medication for sleep at this time, but we will consider amitriptyline in the future if necessary. He has been recommended to purchase ear plugs and eye shades to assist in his ability to sleep. He will return for followup in 2 months.  Thomas Nuttall, PA-C

## 2012-04-28 ENCOUNTER — Other Ambulatory Visit (HOSPITAL_COMMUNITY): Payer: Self-pay | Admitting: Physician Assistant

## 2012-05-19 ENCOUNTER — Other Ambulatory Visit (HOSPITAL_COMMUNITY): Payer: Self-pay | Admitting: Physician Assistant

## 2012-06-15 ENCOUNTER — Ambulatory Visit (INDEPENDENT_AMBULATORY_CARE_PROVIDER_SITE_OTHER): Payer: 59 | Admitting: Physician Assistant

## 2012-06-15 ENCOUNTER — Encounter (HOSPITAL_COMMUNITY): Payer: Self-pay | Admitting: Physician Assistant

## 2012-06-15 DIAGNOSIS — F3160 Bipolar disorder, current episode mixed, unspecified: Secondary | ICD-10-CM

## 2012-06-15 DIAGNOSIS — F319 Bipolar disorder, unspecified: Secondary | ICD-10-CM

## 2012-06-15 NOTE — Progress Notes (Signed)
   [facility] Behavioral Health Follow-up Outpatient Visit  Thomas Woodward April 14, 1976  Date: 06/15/2012   Subjective: Thomas Woodward presents today to followup on his treatment for bipolar disorder. He reports he has been doing fairly well. His mood has been stable. He denies any suicidal or homicidal ideation. He denies any auditory or visual hallucinations, and reports that he has not had any paranoia. His sleep is good, and he gets to bed most nights by midnight. His appetite is good. His home situation is still somewhat difficult, but he is able to tolerate it. He continues to work and attend the [employer] regularly.  There were no vitals filed for this visit.  Mental Status Examination  Appearance: Well groomed and casually dressed Alert: Yes Attention: good  Cooperative: Yes Eye Contact: Good Speech: Clear and coherent Psychomotor Activity: Normal Memory/Concentration: Intact Oriented: person, place, time/date and situation Mood: Dysphoric Affect: Blunt Thought Processes and Associations: Linear Fund of Knowledge: Good Thought Content: Normal Insight: Good Judgement: Good  Diagnosis: Bipolar mixed  Treatment Plan:  Continue Zyprexa 15 mg twice daily, Wellbutrin XL 300 mg daily, and Cogentin 1 mg 3 times daily. Followup in 2 months.   Emmajean Ratledge, PA-C

## 2012-07-28 ENCOUNTER — Other Ambulatory Visit (HOSPITAL_COMMUNITY): Payer: Self-pay | Admitting: Physician Assistant

## 2012-07-28 MED ORDER — BENZTROPINE MESYLATE 1 MG PO TABS: 1.0000 mg | ORAL_TABLET | Freq: Three times a day (TID) | ORAL | Status: DC

## 2012-08-24 ENCOUNTER — Ambulatory Visit (INDEPENDENT_AMBULATORY_CARE_PROVIDER_SITE_OTHER): Payer: 59 | Admitting: Physician Assistant

## 2012-08-24 DIAGNOSIS — F319 Bipolar disorder, unspecified: Secondary | ICD-10-CM

## 2012-08-24 NOTE — Progress Notes (Signed)
   [facility] Behavioral Health Follow-up Outpatient Visit  OLMAN CHATTIN [birth year]  Date: 08/24/2012   Subjective: [name] presents today to followup on his treatment for bipolar disorder. He reports that overall he has been doing well. He does express some increased anxiety secondary to some recent dental work that he has had to get done. He is worried that his Medicaid will not cover all the cost. He also reports that the new Medicare rules will have a quantity limit on his olanzapine to one tablet per day, and he is concerned about being able to continue the medication when that goes into effect. Otherwise his mood has been stable. He continues to live bed around midnight or 1 AM, and has to be at the [facility] at 9 AM. He denies any suicidal or homicidal ideation. He denies any auditory or visual hallucinations.  There were no vitals filed for this visit.  Mental Status Examination  Appearance: Well groomed and casually dressed Alert: Yes Attention: good  Cooperative: Yes Eye Contact: Good Speech: Clear and coherent Psychomotor Activity: Normal Memory/Concentration: Intact Oriented: person, place, time/date and situation Mood: Anxious Affect: Congruent Thought Processes and Associations: Linear Fund of Knowledge: Good Thought Content: Normal Insight: Good Judgement: Good  Diagnosis: Bipolar disorder not otherwise specified  Treatment Plan: We will continue his Zyprexa 15 mg twice daily, Wellbutrin XL 300 mg daily, and Cogentin 1 mg 3 times daily. He will return for followup in 2 months.  Remie Mathison, PA-C

## 2012-09-14 ENCOUNTER — Other Ambulatory Visit (HOSPITAL_COMMUNITY): Payer: Self-pay | Admitting: Physician Assistant

## 2012-09-25 ENCOUNTER — Other Ambulatory Visit (HOSPITAL_COMMUNITY): Payer: Self-pay | Admitting: Physician Assistant

## 2012-10-12 ENCOUNTER — Other Ambulatory Visit (HOSPITAL_COMMUNITY): Payer: Self-pay | Admitting: Physician Assistant

## 2012-10-26 ENCOUNTER — Ambulatory Visit (INDEPENDENT_AMBULATORY_CARE_PROVIDER_SITE_OTHER): Payer: Medicaid Other | Admitting: Physician Assistant

## 2012-10-26 DIAGNOSIS — F319 Bipolar disorder, unspecified: Secondary | ICD-10-CM

## 2012-10-26 DIAGNOSIS — F3130 Bipolar disorder, current episode depressed, mild or moderate severity, unspecified: Secondary | ICD-10-CM

## 2012-12-05 ENCOUNTER — Other Ambulatory Visit (HOSPITAL_COMMUNITY): Payer: Self-pay | Admitting: Physician Assistant

## 2012-12-24 ENCOUNTER — Telehealth (HOSPITAL_COMMUNITY): Payer: Self-pay | Admitting: *Deleted

## 2012-12-24 DIAGNOSIS — F319 Bipolar disorder, unspecified: Secondary | ICD-10-CM

## 2012-12-24 MED ORDER — OLANZAPINE 20 MG PO TABS: 10.0000 mg | ORAL_TABLET | Freq: Two times a day (BID) | ORAL | Status: DC

## 2012-12-24 NOTE — Telephone Encounter (Signed)
Informed pt that provider was informed that insurance did not cover Olazapine at current prescribed dose. Jorje Guild PA ordered Olazapine 20 mg, 1/2 tablet BID.Informed pt of change in dose.  Instructed pt to contact office PRN

## 2012-12-28 ENCOUNTER — Ambulatory Visit (INDEPENDENT_AMBULATORY_CARE_PROVIDER_SITE_OTHER): Payer: 59 | Admitting: Physician Assistant

## 2012-12-28 DIAGNOSIS — F319 Bipolar disorder, unspecified: Secondary | ICD-10-CM

## 2012-12-28 NOTE — Progress Notes (Signed)
   Thomas Woodward  Thomas Woodward May 30, 1976  Date: 12/28/2012   Subjective: Thomas Woodward presents today to followup on history meds for bipolar disorder. He reports that overall everything is going well. He states that he is getting to bed somewhat later than he usually does. He normally goes to bed around 11 or 12, but recently has been staying up to 1:30 or 2. He is going to meetings 2 evenings a week on Thursdays and Sundays, and sometimes goes out with people after the meeting to eat and play chess. He reports that his mood is good overall, but he does have an occasional down day. He denies his having any paranoid delusions. He denies any suicidal or homicidal ideation. He denies any auditory or visual hallucinations.  There were no vitals filed for this Woodward.  Mental Status Examination  Appearance: Casual Alert: Yes Attention: good  Cooperative: Yes Eye Contact: Good Speech: Clear and coherent Psychomotor Activity: Psychomotor Retardation Memory/Concentration: Intact Oriented: person, place, time/date and situation Mood: Dysphoric Affect: Congruent Thought Processes and Associations: Logical Fund of Knowledge: Good Thought Content: Normal Insight: Fair Judgement: Good  Diagnosis: Bipolar disorder not otherwise specified  Treatment Plan: We will continue his Wellbutrin XL 300 mg daily, Cogentin 1 mg 3 times daily, and decrease his Zyprexa to 10 mg twice a day due to the fact that insurance will not pay for his previous dose. He has been instructed to be vigilant for any decompensation in mood or psychotic features. If this occurs we may have to change him to a different antipsychotic medication.  Walid Haig, PA-C

## 2013-01-06 ENCOUNTER — Other Ambulatory Visit (HOSPITAL_COMMUNITY): Payer: Self-pay | Admitting: Physician Assistant

## 2013-01-31 NOTE — Progress Notes (Signed)
   [facility] Behavioral Health Follow-up Outpatient Visit  Thomas Woodward 01-01-1976  Date: 10/26/2012   Subjective: [name] presents today to followup on his treatment for bipolar disorder. He reports that he is experiencing significant amount of stress at home. His brother and his brother's girlfriend argue, and the girlfriend left and left a 38-year-old child in the home. This chaos has affected his mood, and he feels that he is more irritable. He has settled down so since the event no. He states his sleep is okay, but tends stay up late. He admits that he has trouble in getting his day. He [facility] to [facility], and gets about 9 AM. He reports that his appetite is good. He still working at [employer] 3 days a week on Tuesdays Wednesdays and Thursdays. He denies any paranoia. He denies any auditory or visual hallucinations. He endorses some suicidal ideation, but he is able to contract for safety. He denies any homicidal ideation.  There were no vitals filed for this visit.  Mental Status Examination  Appearance: casual Alert: Yes Attention: good  Cooperative: Yes Eye Contact: Good Speech: clear and coherent Psychomotor Activity: Normal Memory/Concentration: intact Oriented: person, place, time/date and situation Mood: Dysphoric Affect: Congruent Thought Processes and Associations: Linear Fund of Knowledge: Good Thought Content: Suicidal ideation Insight: Good Judgement: Good  Diagnosis: bipolar disorder, most recent episode depressed  Treatment Plan: We will continue his Zyprexa 15 mg twice daily, Wellbutrin XL 300 mg daily, and Cogentin 1 mg 3 times daily. He will return for followup in 2 months.   Haydin Dunn, PA-C

## 2013-02-15 ENCOUNTER — Other Ambulatory Visit (HOSPITAL_COMMUNITY): Payer: Self-pay | Admitting: Physician Assistant

## 2013-02-15 DIAGNOSIS — F319 Bipolar disorder, unspecified: Secondary | ICD-10-CM

## 2013-02-22 ENCOUNTER — Encounter (HOSPITAL_COMMUNITY): Payer: Self-pay

## 2013-02-22 ENCOUNTER — Ambulatory Visit (INDEPENDENT_AMBULATORY_CARE_PROVIDER_SITE_OTHER): Payer: 59 | Admitting: Physician Assistant

## 2013-02-22 DIAGNOSIS — F319 Bipolar disorder, unspecified: Secondary | ICD-10-CM

## 2013-02-22 NOTE — Progress Notes (Signed)
   [facility] Behavioral Health Follow-up Outpatient Visit  Thomas Woodward 04-17-1976  Date: 02/22/2013   Subjective: Thomas Woodward presents today to followup on his treatment for bipolar disorder. He reports that his mood has been "pretty good." His brother continues to agitate him at home, which can be bothersome. 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 He continues to have some difficulties in initiating sleep. He denies any suicidal or homicidal ideation. He denies any auditory or visual hallucinations. He endorses some mild paranoia. He feels that he is doing okay on the reduced dose of Zyprexa.  There were no vitals filed for this visit.  Mental Status Examination  Appearance: Casual Alert: Yes Attention: good  Cooperative: Yes Eye Contact: Good Speech: Clear and coherent Psychomotor Activity: Normal Memory/Concentration: Intact Oriented: person, place, time/date and situation Mood: Dysphoric Affect: Congruent Thought Processes and Associations: Linear Fund of Knowledge: Good Thought Content: Normal Insight: Good Judgement: Good  Diagnosis: Bipolar disorder not otherwise specified  Treatment Plan: We will continue his Zyprexa 10 mg twice daily, Wellbutrin XL 300 mg daily, and Cogentin 1 mg 3 times daily. He will return for followup in 2 months. He is encouraged to call between appointments if concerns arise.  Torry Istre, PA-C

## 2013-03-16 ENCOUNTER — Other Ambulatory Visit: Payer: Self-pay | Admitting: Radiology

## 2013-03-16 NOTE — Telephone Encounter (Signed)
Error/ needs to be [employer]

## 2013-03-29 ENCOUNTER — Other Ambulatory Visit (HOSPITAL_COMMUNITY): Payer: Self-pay | Admitting: Physician Assistant

## 2013-04-26 ENCOUNTER — Other Ambulatory Visit (HOSPITAL_COMMUNITY): Payer: Self-pay | Admitting: Physician Assistant

## 2013-04-26 ENCOUNTER — Ambulatory Visit (INDEPENDENT_AMBULATORY_CARE_PROVIDER_SITE_OTHER): Payer: 59 | Admitting: Physician Assistant

## 2013-04-26 ENCOUNTER — Encounter (HOSPITAL_COMMUNITY): Payer: Self-pay | Admitting: Physician Assistant

## 2013-04-26 VITALS — BP 127/74 | HR 75 | Ht 72.0 in | Wt 196.2 lb

## 2013-04-26 DIAGNOSIS — F319 Bipolar disorder, unspecified: Secondary | ICD-10-CM

## 2013-04-26 NOTE — Progress Notes (Signed)
   [facility] Behavioral Health Follow-up Outpatient Visit  Thomas Woodward Nov 11, 1975  Date: 04/26/2013   Subjective: [name] presents today to followup on his treatment for bipolar disorder. When asked how life was, he stated "up and down." He reports that his mood has been "kind of low." He explains that he has set some expectations for his self, and he is disappointed when he doesn't need them. He endorses a significant amount of anxiety, but also states that he is trying to quit smoking. His sleep has improved as he is no longer falling asleep in a chair, or sleeping on top of the bed in his clothes. He is going to bed around midnight, and falls asleep quickly. He states that his thinking is clear, and he is not allowing his imagination to run away with him. He denies any suicidal or homicidal ideation. He denies any auditory or visual hallucinations.  Filed Vitals:   04/26/13 1443  BP: 127/74  Pulse: 75    Mental Status Examination  Appearance: Casual Alert: Yes Attention: good  Cooperative: Yes Eye Contact: Good Speech: Clear and coherent Psychomotor Activity: Normal Memory/Concentration: Intact Oriented: person, place, time/date and situation Mood: Anxious Affect: Flat Thought Processes and Associations: Logical Fund of Knowledge: Fair Thought Content: Normal Insight: Good Judgement: Good  Diagnosis: Bipolar disorder, NOS  Treatment Plan: We will continue his Zyprexa 10 mg twice daily, Wellbutrin XL 300 mg daily, and Cogentin 1 mg 3 times daily. He will return for followup in 2 months.   Kaelei Wheeler, PA-C

## 2013-06-07 ENCOUNTER — Encounter (HOSPITAL_COMMUNITY): Payer: Self-pay | Admitting: Physician Assistant

## 2013-06-07 ENCOUNTER — Ambulatory Visit (INDEPENDENT_AMBULATORY_CARE_PROVIDER_SITE_OTHER): Payer: 59 | Admitting: Physician Assistant

## 2013-06-07 VITALS — BP 125/84 | HR 88 | Ht 72.0 in | Wt 193.4 lb

## 2013-06-07 DIAGNOSIS — F319 Bipolar disorder, unspecified: Secondary | ICD-10-CM

## 2013-06-07 NOTE — Progress Notes (Signed)
   [facility] Behavioral Health Follow-up Outpatient Visit  Thomas Woodward 05-19-1976  Date: 06/07/2013   Subjective: Thomas Woodward presents today to followup on his treatment for bipolar disorder. He reports that he is doing quite well recently. He states that he has begun to attend church again, and has returned to the [employer]. He last attended church in 2002. He feels that his medications are working well. He recently feels that he is thinking more clearly, and is in better touch with reality. He reports that his sleep is good, and states that he goes to bed usually around 12 or 1 AM, then sleeps until 8 AM. He denies that he is experiencing a depression, but states that at times he takes things personally that really have nothing to do with him. He had only reports he quit smoking, and last smoked about 6 weeks ago. He denies any suicidal or homicidal ideation. He denies any auditory or visual hallucinations.  Filed Vitals:   06/07/13 1402  BP: 125/84  Pulse: 88    Mental Status Examination  Appearance: Casual Alert: Yes Attention: good  Cooperative: Yes Eye Contact: Good Speech: Clear and coherent Psychomotor Activity: Normal Memory/Concentration: Intact Oriented: person, place, time/date and situation Mood: Euthymic Affect: Appropriate Thought Processes and Associations: Linear Fund of Knowledge: Good Thought Content: Normal Insight: Good Judgement: Good  Diagnosis: Bipolar disorder not otherwise specified  Treatment Plan: We will continue his Zyprexa 10 mg twice daily, Wellbutrin XL 300 mg daily, and Cogentin 1 mg 3 times daily. He will return for followup in 2 months  Lorene Samaan, PA-C

## 2013-06-29 ENCOUNTER — Other Ambulatory Visit (HOSPITAL_COMMUNITY): Payer: Self-pay | Admitting: Physician Assistant

## 2013-07-11 ENCOUNTER — Other Ambulatory Visit (HOSPITAL_COMMUNITY): Payer: Self-pay | Admitting: Physician Assistant

## 2013-08-16 ENCOUNTER — Encounter (INDEPENDENT_AMBULATORY_CARE_PROVIDER_SITE_OTHER): Payer: Self-pay

## 2013-08-16 ENCOUNTER — Ambulatory Visit (INDEPENDENT_AMBULATORY_CARE_PROVIDER_SITE_OTHER): Payer: 59 | Admitting: Physician Assistant

## 2013-08-16 ENCOUNTER — Encounter (HOSPITAL_COMMUNITY): Payer: Self-pay | Admitting: Physician Assistant

## 2013-08-16 VITALS — BP 112/72 | HR 88 | Ht 70.87 in | Wt 201.0 lb

## 2013-08-16 DIAGNOSIS — F319 Bipolar disorder, unspecified: Secondary | ICD-10-CM

## 2013-08-17 NOTE — Progress Notes (Signed)
   [facility] Behavioral Health Follow-up Outpatient Visit  Thomas Woodward [birth year]  Date:  08/16/2013   Subjective: Thomas Woodward presents today to followup on his treatment for bipolar disorder. He reports that his mood has been "pretty good." His sleep is about the same, that being that he stays up late and then struggles to get up in the morning. He is eating well. He denies any paranoid delusions. He denies any suicidal or homicidal ideation. He denies any auditory or visual hallucinations. He continues to work every Tuesday Wednesday and Thursday, and [facility] the [facility] on Mondays and Fridays. He is also attending church on Sundays, and on some Saturdays he gets involved with church affiliated activities. All in all he is doing very well.  Filed Vitals:   08/16/13 1528  BP: 112/72  Pulse: 88    Mental Status Examination  Appearance: Casual Alert: Yes Attention: good  Cooperative: Yes Eye Contact: Good Speech: Clear and coherent Psychomotor Activity: Normal Memory/Concentration: Intact Oriented: person, place, time/date and situation Mood: Dysphoric Affect: Flat Thought Processes and Associations: Linear Fund of Knowledge: Good Thought Content: Normal Insight: Good Judgement: Good  Diagnosis: Bipolar disorder NOS  Treatment Plan: We will continue his current medications, including Zyprexa 10 mg twice daily, Wellbutrin XL 300 mg daily, and Cogentin 1 mg 3 times daily. He will return for followup in 2 months.  Hadley Soileau, PA-C

## 2013-09-13 ENCOUNTER — Other Ambulatory Visit (HOSPITAL_COMMUNITY): Payer: Self-pay | Admitting: *Deleted

## 2013-09-13 DIAGNOSIS — F319 Bipolar disorder, unspecified: Secondary | ICD-10-CM

## 2013-09-13 MED ORDER — BUPROPION HCL ER (XL) 300 MG PO TB24: ORAL_TABLET | ORAL | Status: DC

## 2013-09-13 MED ORDER — OLANZAPINE 20 MG PO TABS: ORAL_TABLET | ORAL | Status: DC

## 2013-10-18 ENCOUNTER — Ambulatory Visit (HOSPITAL_COMMUNITY): Payer: Self-pay | Admitting: Psychiatry

## 2013-11-03 ENCOUNTER — Encounter (HOSPITAL_COMMUNITY): Payer: Self-pay | Admitting: Psychiatry

## 2013-11-03 ENCOUNTER — Ambulatory Visit (INDEPENDENT_AMBULATORY_CARE_PROVIDER_SITE_OTHER): Payer: 59 | Admitting: Psychiatry

## 2013-11-03 VITALS — BP 132/72 | HR 80 | Ht 72.0 in | Wt 208.0 lb

## 2013-11-03 DIAGNOSIS — F1211 Cannabis abuse, in remission: Secondary | ICD-10-CM

## 2013-11-03 DIAGNOSIS — F319 Bipolar disorder, unspecified: Secondary | ICD-10-CM

## 2013-11-03 DIAGNOSIS — F1011 Alcohol abuse, in remission: Secondary | ICD-10-CM

## 2013-11-03 DIAGNOSIS — F29 Unspecified psychosis not due to a substance or known physiological condition: Secondary | ICD-10-CM

## 2013-11-03 MED ORDER — BENZTROPINE MESYLATE 1 MG PO TABS: ORAL_TABLET | ORAL | Status: DC

## 2013-11-03 MED ORDER — OLANZAPINE 20 MG PO TABS: ORAL_TABLET | ORAL | Status: DC

## 2013-11-03 MED ORDER — BUPROPION HCL ER (XL) 300 MG PO TB24: ORAL_TABLET | ORAL | Status: DC

## 2013-11-03 NOTE — Progress Notes (Signed)
[facility] Behavioral Health [id number] Progress Note  Thomas Woodward [id number] 37 y.o.  11/03/2013 10:34 AM  Chief Complaint:  I need my medication  History of Present Illness: Patient is a 37 year old Caucasian single man who is known to this office since 2002.  Patient has history of bipolar disorder with psychotic features.  He's been seeing the physician assistant who recently left the practice.  Patient is taking Zyprexa, Cogentin and Wellbutrin.  He is sleeping good.  He denies any recent paranoia or any hallucination.  He is more involved in church .  He lives with his mother and his brother .  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  He is not drinking or using any illegal substances.  He had a history of drinking alcohol and using marijuana and cocaine.  He claims to be sober from drinking for more than 12 months.  He has not used any illegal substances in the past 8 years.  Denies any paranoia or any hallucination.  He likes his medication.  His last blood work was done in July 2013.  He denies any tremors or shakes.  Sometimes he forgets taking Cogentin but overall he is compliant with the Zyprexa and Wellbutrin.  He had a good energy level.  He has no tremors or shakes.  He wants to continue his current psychotropic medication.   Suicidal Ideation: No Plan Formed: No Patient has means to carry out plan: No  Homicidal Ideation: No Plan Formed: No Patient has means to carry out plan: No  Review of Systems: Psychiatric: Agitation: No Hallucination: No Depressed Mood: No Insomnia: No Hypersomnia: No Altered Concentration: No Feels Worthless: No Grandiose Ideas: No Belief In Special Powers: No New/Increased Substance Abuse: No Compulsions: No  Neurologic: Headache: No Seizure: No Paresthesias: No  Past Medical Family, Social History: Patient lives with his mother, brother and 16-year-old nephew.  Patient drop out at 10th grade but finished his GED.   He has 4 half-sisters and one half-brother.  He has one brother the brother and one logical sister.  His brother has history of bipolar disorder.  His mother has history of depression.  The patient has hyperlipidemia, GERD.  He used to have high creatinine however since he is not taking lithium his creatinine is stable.  He see Dr. Marina Gravel.  His primary care physician is Pearson Grippe at [facility].  He described his family as dysfunctional.  History of DWI and also on probation however claimed to be sober from drinking for more than one year.  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  Outpatient Encounter Prescriptions as of 11/03/2013  Medication Sig  . omeprazole (PRILOSEC) 20 MG capsule Take 20 mg by mouth daily.  Marland Kitchen terazosin (HYTRIN) 1 MG capsule Take 1 mg by mouth at bedtime.  . benztropine (COGENTIN) 1 MG tablet TAKE 1 TABLET BY MOUTH 3 TIMES DAILY  . buPROPion (WELLBUTRIN XL) 300 MG 24 hr tablet TAKE 1 TABLET BY MOUTH EVERY DAY  . OLANZapine (ZYPREXA) 20 MG tablet TAKE 1/2 TABLET BY MOUTH TWICE A DAY  . [DISCONTINUED] benztropine (COGENTIN) 1 MG tablet TAKE 1 TABLET BY MOUTH 3 TIMES DAILY  . [DISCONTINUED] buPROPion (WELLBUTRIN XL) 300 MG 24 hr tablet TAKE 1 TABLET BY MOUTH EVERY DAY  . [DISCONTINUED] OLANZapine (ZYPREXA) 20 MG tablet TAKE 1/2 TABLET BY MOUTH TWICE A DAY    Past Psychiatric History/Hospitalization(s): Patient has history of multiple psychiatric  hospitalization.  He has several admission in 1994-1995 at [facility] and at [facility].  He has history of taking overdose on Risperdal.  He has history of psychosis, mania and severe depression.  He has been intensive outpatient program.  [facility].  In the past he has taken Abilify, Seroquel, Strattera, lithium, Adderall, Celexa however he did very well on Zyprexa and Wellbutrin.  The patient has history of heavy  drinking and using drugs. Anxiety: No Bipolar Disorder: Yes Depression: Yes Mania: Yes Psychosis: Yes Schizophrenia: No Personality Disorder: No Hospitalization for psychiatric illness: Yes History of Electroconvulsive Shock Therapy: No Prior Suicide Attempts: Yes  Physical Exam: Constitutional:  BP 132/72  Pulse 80  Ht 6' (1.829 m)  Wt 208 lb (94.348 kg)  BMI 28.20 kg/m2  General Appearance: alert, oriented, no acute distress and well nourished  Musculoskeletal: Strength & Muscle Tone: within normal limits Gait & Station: normal Patient leans: N/A  Psychiatric: Speech (describe rate, volume, coherence, spontaneity, and abnormalities if any): Slow but clear and coherent he has a normal tone and volume.  Thought Process (describe rate, content, abstract reasoning, and computation): Logical and goal directed.  Associations: Intact  Thoughts: Preoccupied with his religion but no delusion of any psychosis.  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  Patient is doing better on Zyprexa, Wellbutrin and Cogentin.  His last BUN creatinine is normal.  His hemoglobin A1c is 5.6 which was done in July 2014.  Patient does not have any  side effects of medication.  I will continue his current psychotropic medication.  Continue to encourage him to participate in church and volunteer work.  Recommend to call us back if he has any question or any concern.  Followup in 3 months.Time spent 25 minutes.  More than 50% of the time spent in psychoeducation, counseling and coordination of care.  Discuss safety plan that anytime having active suicidal thoughts or homicidal thoughts then patient need to call 911 or go to the local emergency room.    Alexea Blase T., MD 11/03/2013         I need my medication

## 2013-11-27 ENCOUNTER — Other Ambulatory Visit (HOSPITAL_COMMUNITY): Payer: Self-pay | Admitting: Physician Assistant

## 2013-11-30 ENCOUNTER — Other Ambulatory Visit (HOSPITAL_COMMUNITY): Payer: Self-pay | Admitting: Physician Assistant

## 2013-11-30 ENCOUNTER — Other Ambulatory Visit (HOSPITAL_COMMUNITY): Payer: Self-pay | Admitting: Psychiatry

## 2014-01-05 ENCOUNTER — Other Ambulatory Visit (HOSPITAL_COMMUNITY): Payer: Self-pay | Admitting: *Deleted

## 2014-01-05 DIAGNOSIS — F319 Bipolar disorder, unspecified: Secondary | ICD-10-CM

## 2014-01-05 MED ORDER — OLANZAPINE 20 MG PO TABS: ORAL_TABLET | ORAL | Status: DC

## 2014-01-05 NOTE — Telephone Encounter (Signed)
Received faxed request for Olanzapine refill. Per fax, last refill received by pharmacy was 09/13/13 and was filled 09/29/13. Per chart, last prescription sent 11/03/13 - "Receipt confirmed by pharmacy 11/03/13" Per Robin at pharmacy, never received Will resend at this time

## 2014-02-01 ENCOUNTER — Ambulatory Visit (INDEPENDENT_AMBULATORY_CARE_PROVIDER_SITE_OTHER): Payer: 59 | Admitting: Psychiatry

## 2014-02-01 ENCOUNTER — Encounter (HOSPITAL_COMMUNITY): Payer: Self-pay | Admitting: Psychiatry

## 2014-02-01 VITALS — BP 119/67 | HR 84 | Ht 72.0 in | Wt 203.2 lb

## 2014-02-01 DIAGNOSIS — F319 Bipolar disorder, unspecified: Secondary | ICD-10-CM

## 2014-02-01 DIAGNOSIS — F1011 Alcohol abuse, in remission: Secondary | ICD-10-CM

## 2014-02-01 DIAGNOSIS — F1211 Cannabis abuse, in remission: Secondary | ICD-10-CM

## 2014-02-01 DIAGNOSIS — F29 Unspecified psychosis not due to a substance or known physiological condition: Secondary | ICD-10-CM

## 2014-02-01 MED ORDER — BENZTROPINE MESYLATE 1 MG PO TABS: ORAL_TABLET | ORAL | Status: DC

## 2014-02-01 MED ORDER — OLANZAPINE 20 MG PO TABS: ORAL_TABLET | ORAL | Status: DC

## 2014-02-01 MED ORDER — BUPROPION HCL ER (XL) 300 MG PO TB24: ORAL_TABLET | ORAL | Status: DC

## 2014-02-01 NOTE — Progress Notes (Signed)
[facility] [id number] Progress Note  ADEEB KONECNY [id number] 38 y.o.  02/01/2014 4:56 PM  Chief Complaint:  Medication management and followup.    History of Present Illness: Cudd came for his followup appointment.  He is compliant with his psychotropic medication.  He admitted situational stress at home.  The patient told his brother does not get along sometime with him.  He admitted sometimes irritability , racing thoughts and insomnia but overall he is doing better.  He seeing therapist Allena Earing every month .  Patient also started a support group which is helping his anxiety and depression.  Patient denies any paranoia or any hallucination.  He denies any tremors or shakes.  He denies any aggression or violence.  Patient is not drinking or using any illegal substances.  He is very involved in church.  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    Suicidal Ideation: No Plan Formed: No Patient has means to carry out plan: No  Homicidal Ideation: No Plan Formed: No Patient has means to carry out plan: No  Review of Systems: Psychiatric: Agitation: No Hallucination: No Depressed Mood: No Insomnia: No Hypersomnia: No Altered Concentration: No Feels Worthless: No Grandiose Ideas: No Belief In Special Powers: No New/Increased Substance Abuse: No Compulsions: No  Neurologic: Headache: No Seizure: No Paresthesias: No  Past Medical Family, Social History: Patient lives with his mother, brother and 63-year-old nephew.  Patient drop out at 10th grade but finished his GED.  He has 4 half-sisters and one half-brother.  He has one brother the brother and one logical sister.  His brother has history of bipolar disorder.  His mother has history of depression.  The patient has hyperlipidemia, GERD.  He used to have high creatinine however since he is not taking lithium his creatinine is stable.  He see Dr. Marina Gravel.  His primary care physician is  Pearson Grippe at [facility].  He described his family as dysfunctional.  History of DWI and also on probation however claimed to be sober from drinking for more than one year.  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  Outpatient Encounter Prescriptions as of 02/01/2014  Medication Sig  . benztropine (COGENTIN) 1 MG tablet TAKE 1 TABLET BY MOUTH 3 TIMES DAILY  . buPROPion (WELLBUTRIN XL) 300 MG 24 hr tablet TAKE 1 TABLET BY MOUTH EVERY DAY  . OLANZapine (ZYPREXA) 20 MG tablet TAKE 1/2 TABLET BY MOUTH TWICE A DAY  . omega-3 acid ethyl esters (LOVAZA) 1 G capsule Take 1 g by mouth 2 (two) times daily.  Marland Kitchen omeprazole (PRILOSEC) 20 MG capsule Take 20 mg by mouth daily.  Marland Kitchen terazosin (HYTRIN) 1 MG capsule Take 1 mg by mouth at bedtime.  . [DISCONTINUED] benztropine (COGENTIN) 1 MG tablet TAKE 1 TABLET BY MOUTH 3 TIMES DAILY  . [DISCONTINUED] buPROPion (WELLBUTRIN XL) 300 MG 24 hr tablet TAKE 1 TABLET BY MOUTH EVERY DAY  . [DISCONTINUED] OLANZapine (ZYPREXA) 20 MG tablet TAKE 1/2 TABLET BY MOUTH TWICE A DAY    Past Psychiatric History/Hospitalization(s): Patient has history of multiple psychiatric hospitalization.  He has several admission in 1994-1995 at [facility] and at [place].  He has history of taking overdose on Risperdal.  He has history of psychosis, mania and severe depression.  He has been intensive outpatient program.  [facility]  In the past he has taken  Abilify, Seroquel, Strattera, lithium, Adderall, Celexa however he did very well on Zyprexa and Wellbutrin.  The patient has history of heavy drinking and using drugs. Anxiety: No Bipolar Disorder: Yes Depression: Yes Mania: Yes Psychosis: Yes Schizophrenia: No Personality Disorder: No Hospitalization for psychiatric illness: Yes History of Electroconvulsive Shock Therapy: No Prior Suicide Attempts: Yes  Physical  Exam: Constitutional:  BP 119/67  Pulse 84  Ht 6' (1.829 m)  Wt 203 lb 3.2 oz (92.171 kg)  BMI 27.55 kg/m2  General Appearance: alert, oriented, no acute distress and well nourished  Musculoskeletal: Strength & Muscle Tone: within normal limits Gait & Station: normal Patient leans: N/A  Psychiatric: Speech (describe rate, volume, coherence, spontaneity, and abnormalities if any): Slow but clear and coherent he has a normal tone and volume.  Thought Process (describe rate, content, abstract reasoning, and computation): Logical and goal directed.  Associations: Intact  Thoughts: Preoccupied with his religion but no delusion of any psychosis.  Mental Status: Orientation: oriented to person, place, time/date, situation, day of week, month of year and year Mood & Affect: anxiety Attention Span & Concentration: Fair  Established Problem, Stable/Improving (1), Review of Psycho-Social Stressors (1), Review of Last Therapy Session (1) and Review of Medication Regimen & Side Effects (2)  Assessment: Axis I: Bipolar disorder with psychotic features, alcohol abuse in remission, cannabis abuse in remission  Axis II: Deferred  Axis III: Hyperlipidemia, GERD  Axis IV: Mild  Axis V: 50-55   Plan:  Patient is doing better on Zyprexa, Wellbutrin and Cogentin.  Recommended to see his therapist Ollen Grossllen Wilson for counseling .  I discussed the risk and benefits of medication.  Patient does not have any tremors or shakes.  Recommended to call us back if he has any question or any concern.   I will see him again in 3 months.   Tashae Inda T., MD 02/01/2014

## 2014-05-03 ENCOUNTER — Ambulatory Visit (HOSPITAL_COMMUNITY): Payer: Self-pay | Admitting: Psychiatry

## 2014-05-09 ENCOUNTER — Ambulatory Visit (INDEPENDENT_AMBULATORY_CARE_PROVIDER_SITE_OTHER): Payer: 59 | Admitting: Psychiatry

## 2014-05-09 ENCOUNTER — Encounter (HOSPITAL_COMMUNITY): Payer: Self-pay | Admitting: Psychiatry

## 2014-05-09 VITALS — BP 134/79 | HR 81 | Ht 72.0 in | Wt 203.4 lb

## 2014-05-09 DIAGNOSIS — F319 Bipolar disorder, unspecified: Secondary | ICD-10-CM

## 2014-05-09 DIAGNOSIS — F1211 Cannabis abuse, in remission: Secondary | ICD-10-CM

## 2014-05-09 DIAGNOSIS — F29 Unspecified psychosis not due to a substance or known physiological condition: Secondary | ICD-10-CM

## 2014-05-09 DIAGNOSIS — F1011 Alcohol abuse, in remission: Secondary | ICD-10-CM

## 2014-05-09 MED ORDER — DIVALPROEX SODIUM 250 MG PO DR TAB
250.0000 mg | DELAYED_RELEASE_TABLET | Freq: Every day | ORAL | Status: DC
Start: 2014-05-09 — End: 2014-06-06

## 2014-05-09 MED ORDER — BUPROPION HCL ER (XL) 300 MG PO TB24: ORAL_TABLET | ORAL | Status: DC

## 2014-05-09 MED ORDER — BENZTROPINE MESYLATE 1 MG PO TABS: ORAL_TABLET | ORAL | Status: DC

## 2014-05-09 MED ORDER — OLANZAPINE 20 MG PO TABS: ORAL_TABLET | ORAL | Status: DC

## 2014-05-09 NOTE — Progress Notes (Addendum)
[facility] [id number] Progress Note  Thomas LeavensClifford D Woodward [id number] 38 y.o.  05/09/2014 4:16 PM  Chief Complaint:  Sometime I cannot sleep.  I continues to have issues with my brother .     History of Present Illness: Thomas HeroLatham came for his followup appointment.  He is taking his medication as prescribed.  He endorsed some time irritability anger and frustration on his brother.  He also endorses racing thoughts and poor sleep.  He endorsed some time grandiosity and he remember an incident at church when he felt that God has given too much talent and power .  However he realized that it could be his mania .  Patient has not told his symptoms to his therapist.  He is seeing therapist Allena Earingllen Wilson every month .  He is also going to a support group which is helping his anxiety.  He admitted some time paranoia and delusions but denies any aggression or violence.  He had a good response to the lithium in the past but he developed lithium toxicity and his kidney function get worst.  Patient denied any suicidal thoughts or homicidal thoughts.  He is very involved in church.  He continues to work part-time.  He lives with his brother and his mother.  He is not drinking or using any illegal substances.  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    Suicidal Ideation: No Plan Formed: No Patient has means to carry out plan: No  Homicidal Ideation: No Plan Formed: No Patient has means to carry out plan: No  Review of Systems: Psychiatric: Agitation: Yes Hallucination: No Depressed Mood: No Insomnia: Yes Hypersomnia: No Altered Concentration: No Feels Worthless: No Grandiose Ideas: Yes Belief In Special Powers: No New/Increased Substance Abuse: No Compulsions: No  Neurologic: Headache: No Seizure: No Paresthesias: No  Past Medical Family, Social History: Patient lives with his mother and brother. Patient drop out at 10th grade but finished his GED.  He has 4 half-sisters and one  half-brother.  He has one brother the brother and one logical sister.  His brother has history of bipolar disorder.  His mother has history of depression.  The patient has hyperlipidemia, GERD.  He used to have high creatinine however since he is not taking lithium his creatinine is stable.  He see Dr. Marina Gravelichard Fox.  His primary care physician is Pearson [facility]rippeJames Kim at [facility].  He described his family as dysfunctional.  History of DWI and also on probation however claimed to be sober from drinking for more than one year.  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  Outpatient Encounter Prescriptions as of 05/09/2014  Medication Sig  . benztropine (COGENTIN) 1 MG tablet TAKE 1 TABLET BY MOUTH 3 TIMES DAILY  . buPROPion (WELLBUTRIN XL) 300 MG 24 hr tablet TAKE 1 TABLET BY MOUTH EVERY DAY  . divalproex (DEPAKOTE) 250 MG DR tablet Take 1 tablet (250 mg total) by mouth at bedtime.  Marland Kitchen. OLANZapine (ZYPREXA) 20 MG tablet TAKE 1/2 TABLET BY MOUTH TWICE A DAY  . omega-3 acid ethyl esters (LOVAZA) 1 G capsule Take 1 g by mouth 2 (two) times daily.  Marland Kitchen. omeprazole (PRILOSEC) 20 MG capsule Take 20 mg by mouth daily.  Marland Kitchen. terazosin (HYTRIN) 1 MG capsule Take 1 mg by mouth at bedtime.  . [DISCONTINUED] benztropine (COGENTIN) 1 MG tablet TAKE 1 TABLET BY MOUTH 3 TIMES DAILY  . [DISCONTINUED] buPROPion (WELLBUTRIN XL) 300 MG 24 hr  tablet TAKE 1 TABLET BY MOUTH EVERY DAY  . [DISCONTINUED] OLANZapine (ZYPREXA) 20 MG tablet TAKE 1/2 TABLET BY MOUTH TWICE A DAY    Past Psychiatric History/Hospitalization(s): Patient has history of multiple psychiatric hospitalization.  He has [facility] and [facility].  He has history of taking overdose on Risperdal.  He has history of psychosis, mania and severe depression.  He has been intensive outpatient program.  [facility]  In the past he has  taken Abilify, Seroquel, Strattera, lithium, Adderall, Celexa however he did very well on Zyprexa and Wellbutrin.  The patient has history of heavy drinking and using drugs. Anxiety: No Bipolar Disorder: Yes Depression: Yes Mania: Yes Psychosis: Yes Schizophrenia: No Personality Disorder: No Hospitalization for psychiatric illness: Yes History of Electroconvulsive Shock Therapy: No Prior Suicide Attempts: Yes  Physical Exam: Constitutional:  BP 134/79  Pulse 81  Ht 6' (1.829 m)  Wt 203 lb 6.4 oz (92.262 kg)  BMI 27.58 kg/m2  General Appearance: alert, oriented, no acute distress and well nourished  Musculoskeletal: Strength & Muscle Tone: within normal limits Gait & Station: normal Patient leans: N/A  Psychiatric: Speech (describe rate, volume, coherence, spontaneity, and abnormalities if any): Slow but clear and coherent he has a normal tone and volume.  Thought Process (describe rate, content, abstract reasoning, and computation): Logical and goal directed.  Associations: Intact  Thoughts: Preoccupied with his religion but no delusion of any psychosis.  Mental Status: Orientation: oriented to person, place, time/date, situation, day of week, month of year and year Mood & Affect: anxiety Attention Span & Concentration: Fair  Established Problem, Stable/Improving (1), Review of Psycho-Social Stressors (1), Review of Last Therapy Session (1) and Review of Medication Regimen & Side Effects (2)  Assessment: Axis I: Bipolar disorder with psychotic features, alcohol abuse in remission, cannabis abuse in remission  Axis II: Deferred  Axis III: Hyperlipidemia, GERD  Axis IV: Mild  Axis V: 50-55   Plan:  I recommended to add Depakote 250 mg at bedtime to help his irritability, insomnia .  After some discussion patient agreed.  I will continue Zyprexa , Wellbutrin and Cogentin at present dose.  Recommended to see his therapist Ollen Grossllen Wilson for counseling .  Recommended  to open discussion with him.  I discussed the risks and benefits of medication.  At this time which does not have any side effects of medication.  I will see him again in 4 weeks.  Recommended to call us back if he has any question or any concern.Time spent 25 minutes.  More than 50% of the time spent in psychoeducation, counseling and coordination of care.  Discuss safety plan that anytime having active suicidal thoughts or homicidal thoughts then patient need to call 911 or go to the local emergency room.  Marvell Tamer T., MD 05/09/2014   Addendum Please see the blood work from his kidney doctor.  His BUN is 40 and his creatinine is 1.19.  His blood was drawn on 03/30/2014.

## 2014-06-06 ENCOUNTER — Ambulatory Visit (INDEPENDENT_AMBULATORY_CARE_PROVIDER_SITE_OTHER): Payer: 59 | Admitting: Psychiatry

## 2014-06-06 ENCOUNTER — Ambulatory Visit (HOSPITAL_COMMUNITY): Payer: Self-pay | Admitting: Psychiatry

## 2014-06-06 ENCOUNTER — Encounter (HOSPITAL_COMMUNITY): Payer: Self-pay | Admitting: Psychiatry

## 2014-06-06 VITALS — BP 128/69 | HR 75 | Ht 72.0 in | Wt 208.8 lb

## 2014-06-06 DIAGNOSIS — F319 Bipolar disorder, unspecified: Secondary | ICD-10-CM

## 2014-06-06 MED ORDER — DIVALPROEX SODIUM 250 MG PO DR TAB: 250.0000 mg | DELAYED_RELEASE_TABLET | Freq: Every day | ORAL | Status: DC

## 2014-06-06 MED ORDER — BENZTROPINE MESYLATE 1 MG PO TABS: ORAL_TABLET | ORAL | Status: DC

## 2014-06-06 NOTE — Progress Notes (Signed)
[facility] [id number] Progress Note  Thomas Woodward [id number] 38 y.o.  06/06/2014 5:08 PM  Chief Complaint:  Medication management and followup.     History of Present Illness: Thomas Woodward came for his followup appointment.  On his last visit we started him Depakote 250 mg at bedtime.  He is feeling much better.  He is less irritable and less angry.  His sleep is improved.  He is taking his medication as prescribed.  He continues to have issues with his son but he is handling it very well.  He is seeing therapist Thomas Woodward every month.  He is also going to a support group which helped his anxiety.  Patient lives with his brother and his mother.  Patient does not drink or use any illegal substances.  He is also working as a Agricultural consultant at [employer].    Suicidal Ideation: No Plan Formed: No Patient has means to carry out plan: No  Homicidal Ideation: No Plan Formed: No Patient has means to carry out plan: No  Review of Systems: Psychiatric: Agitation: No Hallucination: No Depressed Mood: No Insomnia: No Hypersomnia: No Altered Concentration: No Feels Worthless: No Grandiose Ideas: Yes Belief In Special Powers: No New/Increased Substance Abuse: No Compulsions: No  Neurologic: Headache: No Seizure: No Paresthesias: No  Past Medical Family, Social History:  Patient has hyperlipidemia, GERD.  He used to have high creatinine however since he is not taking lithium his creatinine is stable.  His last BUN is 40 and creatinine 1.19 which was drawn in May 2015 . He see Dr. Marina Woodward.  His primary care physician is Thomas Woodward at [facility].   Outpatient Encounter Prescriptions as of 06/06/2014  Medication Sig  . benztropine (COGENTIN) 1 MG tablet TAKE 1 TABLET BY MOUTH 2 TIMES DAILY  . buPROPion (WELLBUTRIN XL) 300 MG 24 hr tablet TAKE 1 TABLET BY MOUTH EVERY DAY  . divalproex (DEPAKOTE) 250 MG DR tablet Take 1 tablet (250 mg total) by mouth at bedtime.  Marland Kitchen  OLANZapine (ZYPREXA) 20 MG tablet TAKE 1/2 TABLET BY MOUTH TWICE A DAY  . omega-3 acid ethyl esters (LOVAZA) 1 G capsule Take 1 g by mouth 2 (two) times daily.  Marland Kitchen omeprazole (PRILOSEC) 20 MG capsule Take 20 mg by mouth daily.  Marland Kitchen terazosin (HYTRIN) 1 MG capsule Take 1 mg by mouth at bedtime.  . [DISCONTINUED] benztropine (COGENTIN) 1 MG tablet TAKE 1 TABLET BY MOUTH 3 TIMES DAILY  . [DISCONTINUED] divalproex (DEPAKOTE) 250 MG DR tablet Take 1 tablet (250 mg total) by mouth at bedtime.    Past Psychiatric History/Hospitalization(s): Patient has history of multiple psychiatric hospitalization.  He has several admission in 1994-1995 at [facility] and at [place].  He has history of taking overdose on Risperdal.  He has history of psychosis, mania and severe depression.  He has been intensive outpatient program.  [facility]  In the past he has taken Abilify, Seroquel, Strattera, lithium, Adderall, Celexa however he did very well on Zyprexa and Wellbutrin.  The patient has history of heavy drinking and using drugs. Anxiety: No Bipolar Disorder: Yes Depression: Yes Mania: Yes Psychosis: Yes Schizophrenia: No Personality Disorder: No Hospitalization for psychiatric illness: Yes History of Electroconvulsive Shock Therapy: No Prior Suicide Attempts: Yes  Physical Exam: Constitutional:  BP 128/69  Pulse 75  Ht 6' (1.829 m)  Wt 208 lb 12.8 oz (94.711 kg)  BMI 28.31 kg/m2  General Appearance:  alert, oriented, no acute distress and well nourished  Musculoskeletal: Strength & Muscle Tone: within normal limits Gait & Station: normal Patient leans: N/A  Psychiatric: Speech (describe rate, volume, coherence, spontaneity, and abnormalities if any): Slow but clear and coherent he has a normal tone and volume.  Thought Process (describe rate, content, abstract reasoning, and computation): Logical and goal directed.  Associations:  Intact  Thoughts: normal  Mental Status: Orientation: oriented to person, place, time/date, situation, day of week, month of year and year Mood & Affect: anxiety Attention Span & Concentration: Fair  Established Problem, Stable/Improving (1), Review of Psycho-Social Stressors (1), Review of Last Therapy Session (1) and Review of Medication Regimen & Side Effects (2)  Assessment: Axis I: Bipolar disorder with psychotic features, alcohol abuse in remission, cannabis abuse in remission  Axis II: Deferred  Axis III: Hyperlipidemia, GERD  Axis IV: Mild  Axis V: 50-55   Plan:  Patient is doing better since we started him on Depakote.  I recommended to continue Wellbutrin XL 300 mg daily, Zyprexa 20 mg half tablet twice a day.  He is taking Cogentin 1 mg 3 times a day and I suggested to try twice a day which will help his dry mouth.  Recommended to continue counseling and call us back if he has any question or any concern.  I will see him again in 2 months.  Thomas Woodward T., MD 06/06/2014

## 2014-06-18 ENCOUNTER — Other Ambulatory Visit (HOSPITAL_COMMUNITY): Payer: Self-pay | Admitting: Psychiatry

## 2014-06-21 ENCOUNTER — Other Ambulatory Visit (HOSPITAL_COMMUNITY): Payer: Self-pay | Admitting: Psychiatry

## 2014-06-21 NOTE — Telephone Encounter (Signed)
Given 05/09/14 for 90 days .

## 2014-08-08 ENCOUNTER — Ambulatory Visit (HOSPITAL_COMMUNITY): Payer: Self-pay | Admitting: Psychiatry

## 2014-08-18 ENCOUNTER — Ambulatory Visit (INDEPENDENT_AMBULATORY_CARE_PROVIDER_SITE_OTHER): Payer: 59 | Admitting: Psychiatry

## 2014-08-18 ENCOUNTER — Encounter (HOSPITAL_COMMUNITY): Payer: Self-pay | Admitting: Psychiatry

## 2014-08-18 VITALS — BP 105/71 | HR 71 | Ht 71.0 in | Wt 213.0 lb

## 2014-08-18 DIAGNOSIS — F29 Unspecified psychosis not due to a substance or known physiological condition: Secondary | ICD-10-CM

## 2014-08-18 DIAGNOSIS — F319 Bipolar disorder, unspecified: Secondary | ICD-10-CM

## 2014-08-18 DIAGNOSIS — F1019 Alcohol abuse with unspecified alcohol-induced disorder: Secondary | ICD-10-CM

## 2014-08-18 DIAGNOSIS — F1210 Cannabis abuse, uncomplicated: Secondary | ICD-10-CM

## 2014-08-18 DIAGNOSIS — F3131 Bipolar disorder, current episode depressed, mild: Secondary | ICD-10-CM

## 2014-08-18 MED ORDER — BUPROPION HCL ER (XL) 300 MG PO TB24: ORAL_TABLET | ORAL | Status: DC

## 2014-08-18 MED ORDER — BENZTROPINE MESYLATE 1 MG PO TABS: ORAL_TABLET | ORAL | Status: DC

## 2014-08-18 MED ORDER — DIVALPROEX SODIUM 250 MG PO DR TAB: 250.0000 mg | DELAYED_RELEASE_TABLET | Freq: Every day | ORAL | Status: DC

## 2014-08-18 MED ORDER — OLANZAPINE 20 MG PO TABS: 20.0000 mg | ORAL_TABLET | Freq: Every day | ORAL | Status: DC

## 2014-08-18 NOTE — Progress Notes (Signed)
[facility] [id number] Progress Note  Thomas LeavensClifford D Woodward [id number] 38 y.o.  08/18/2014 3:43 PM  Chief Complaint:  Medication management and followup.     History of Present Illness: Thomas HeroLatham came for his followup appointment.  He stopped taking Depakote because he felt it was not helping him.  He continues to have poor sleep.  He was taking Depakote in the morning and it was causing him sedated due to the day.  He is also taking Zyprexa 10 mg twice a day.  Aside insomnia his mood has been stable.  He denies any paranoia or any agitation.  He continues to have trust issue but denies any recent hallucination irritability or any anger.  Sometimes he does not get along with his brother who also has mental illness.  He denies any shakes or tremors.  He continues to go to support group and also seeing therapist Thomas Woodward every month.  Patient lives with his brother and his mother.  Patient does not drink or use any illegal substances.  His appetite is okay.  His vitals are stable.  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    Suicidal Ideation: No Plan Formed: No Patient has means to carry out plan: No  Homicidal Ideation: No Plan Formed: No Patient has means to carry out plan: No  Review of Systems: Psychiatric: Agitation: No Hallucination: No Depressed Mood: No Insomnia: No Hypersomnia: No Altered Concentration: No Feels Worthless: No Grandiose Ideas: Yes Belief In Special Powers: No New/Increased Substance Abuse: No Compulsions: No  Neurologic: Headache: No Seizure: No Paresthesias: No  Past Medical Family, Social History:  Patient has hyperlipidemia, GERD.  He used to have high creatinine however since he is not taking lithium his creatinine is stable.  His last BUN is 40 and creatinine 1.19 which was drawn in May 2015 . He see Dr. Marina Gravelichard Fox.  His [facility].   Outpatient Encounter Prescriptions as of 08/18/2014  Medication Sig   . benztropine (COGENTIN) 1 MG tablet TAKE 1 TABLET BY MOUTH 2 TIMES DAILY  . buPROPion (WELLBUTRIN XL) 300 MG 24 hr tablet TAKE 1 TABLET BY MOUTH EVERY DAY  . divalproex (DEPAKOTE) 250 MG DR tablet Take 1 tablet (250 mg total) by mouth at bedtime.  Marland Kitchen. OLANZapine (ZYPREXA) 20 MG tablet Take 1 tablet (20 mg total) by mouth at bedtime.  Marland Kitchen. omega-3 acid ethyl esters (LOVAZA) 1 G capsule Take 1 g by mouth 2 (two) times daily.  Marland Kitchen. omeprazole (PRILOSEC) 20 MG capsule Take 20 mg by mouth daily.  Marland Kitchen. terazosin (HYTRIN) 1 MG capsule Take 1 mg by mouth at bedtime.  . [DISCONTINUED] benztropine (COGENTIN) 1 MG tablet TAKE 1 TABLET BY MOUTH 2 TIMES DAILY  . [DISCONTINUED] buPROPion (WELLBUTRIN XL) 300 MG 24 hr tablet TAKE 1 TABLET BY MOUTH EVERY DAY  . [DISCONTINUED] divalproex (DEPAKOTE) 250 MG DR tablet Take 1 tablet (250 mg total) by mouth at bedtime.  . [DISCONTINUED] OLANZapine (ZYPREXA) 20 MG tablet TAKE 1/2 TABLET BY MOUTH TWICE A DAY    Past Psychiatric History/Hospitalization(s): Patient has history of multiple psychiatric hospitalization.  He has [facility] admission [facility] 1994[facility] and [facility].  He has history of taking overdose on Risperdal.  He has history of psychosis, mania and severe depression.  He has been intensive outpatient program.  [facility]  In the past he has taken Abilify, Seroquel, Strattera, lithium, Adderall,  Celexa however he did very well on Zyprexa and Wellbutrin.  The patient has history of heavy drinking and using drugs. Anxiety: No Bipolar Disorder: Yes Depression: Yes Mania: Yes Psychosis: Yes Schizophrenia: No Personality Disorder: No Hospitalization for psychiatric illness: Yes History of Electroconvulsive Shock Therapy: No Prior Suicide Attempts: Yes  Physical Exam: Constitutional:  BP 105/71  Pulse 71  Ht 5\' 11"  (1.803 m)  Wt 213 lb (96.616 kg)  BMI 29.72 kg/m2  General Appearance:  alert, oriented, no acute distress and well nourished  Musculoskeletal: Strength & Muscle Tone: within normal limits Gait & Station: normal Patient leans: N/A  Psychiatric: Speech (describe rate, volume, coherence, spontaneity, and abnormalities if any): Slow but clear and coherent he has a normal tone and volume.  Thought Process (describe rate, content, abstract reasoning, and computation): Logical and goal directed.  Associations: Intact  Thoughts: normal  Mental Status: Orientation: oriented to person, place, time/date, situation, day of week, month of year and year Mood & Affect: anxiety Attention Span & Concentration: Fair  Established Problem, Stable/Improving (1), Review of Last Therapy Session (1) and Review of Medication Regimen & Side Effects (2)  Assessment: Axis I: Bipolar disorder with psychotic features, alcohol abuse in remission, cannabis abuse in remission  Axis II: Deferred  Axis III: Hyperlipidemia, GERD  Axis IV: Mild  Axis V: 50-55   Plan:  Recommended to take Depakote at bedtime and also moved Zyprexa 20 mg on at bedtime to help insomnia.  Continue Wellbutrin in the morning.  Discussed medication side effects and details.  Continue Cogentin 1 mg twice a day.  I will see him again in 3 months unless symptoms started to get worse.  Sharlett Lienemann T., MD 08/18/2014

## 2014-09-23 ENCOUNTER — Other Ambulatory Visit (HOSPITAL_COMMUNITY): Payer: Self-pay | Admitting: Psychiatry

## 2014-09-24 NOTE — Telephone Encounter (Signed)
Chart reviewed, refill too soon. Filled 10/15 for a 3 month supply. Refill too soon. Denied.

## 2014-10-14 ENCOUNTER — Other Ambulatory Visit (HOSPITAL_COMMUNITY): Payer: Self-pay | Admitting: Psychiatry

## 2014-10-14 NOTE — Telephone Encounter (Signed)
Given 08/18/14 for 90 days. Too soon to refill.

## 2014-11-17 ENCOUNTER — Other Ambulatory Visit (HOSPITAL_COMMUNITY): Payer: Self-pay | Admitting: Psychiatry

## 2014-11-18 NOTE — Telephone Encounter (Signed)
Attempted to call patient to verify he had enough medication until Monday 11/21/14 appointment and message left to call back if orders needed prior to then.

## 2014-11-21 ENCOUNTER — Ambulatory Visit (INDEPENDENT_AMBULATORY_CARE_PROVIDER_SITE_OTHER): Payer: 59 | Admitting: Psychiatry

## 2014-11-21 ENCOUNTER — Encounter (HOSPITAL_COMMUNITY): Payer: Self-pay | Admitting: Psychiatry

## 2014-11-21 ENCOUNTER — Ambulatory Visit (HOSPITAL_COMMUNITY): Payer: Self-pay | Admitting: Psychiatry

## 2014-11-21 VITALS — BP 114/71 | HR 75 | Ht 71.75 in | Wt 203.6 lb

## 2014-11-21 DIAGNOSIS — F1221 Cannabis dependence, in remission: Secondary | ICD-10-CM

## 2014-11-21 DIAGNOSIS — F1021 Alcohol dependence, in remission: Secondary | ICD-10-CM

## 2014-11-21 DIAGNOSIS — F319 Bipolar disorder, unspecified: Secondary | ICD-10-CM

## 2014-11-21 DIAGNOSIS — F3131 Bipolar disorder, current episode depressed, mild: Secondary | ICD-10-CM

## 2014-11-21 MED ORDER — BUPROPION HCL ER (XL) 300 MG PO TB24: ORAL_TABLET | ORAL | Status: DC

## 2014-11-21 MED ORDER — DIVALPROEX SODIUM 500 MG PO DR TAB: 500.0000 mg | DELAYED_RELEASE_TABLET | Freq: Every day | ORAL | Status: DC

## 2014-11-21 MED ORDER — BENZTROPINE MESYLATE 1 MG PO TABS: ORAL_TABLET | ORAL | Status: DC

## 2014-11-21 MED ORDER — OLANZAPINE 20 MG PO TABS: 20.0000 mg | ORAL_TABLET | Freq: Every day | ORAL | Status: DC

## 2014-11-21 NOTE — Progress Notes (Signed)
[facility] [id number] Progress Note  Thomas Woodward [id number] 39 y.o.  11/21/2014 4:53 PM  Chief Complaint:  I have been more irritable and angry.       History of Present Illness: Thomas Woodward came for his followup appointment.  He is complaining of increased irritability, anger and mood swing.  He reported not able to sleep very well and sometime he feel very paranoid .  He also mentioned not getting along with his brother and sometime he have argument with him.  He is taking his medication as prescribed.  He denies any shakes or tremors.  He continues to have trust issue with people.  He goes to church regularly and sometime he question that by he is not getting better.  He is seeing Providence CrosbyAlan Wilson every month for counseling.  Lately he has noticed passive and fleeting suicidal thoughts but denies any plan or any intent.  Patient denies drinking or using any illegal substances.  Patient lives with his brother and mother.  He has no children.  His appetite is okay.  His vitals are stable.  He continues to work at [employer].    Suicidal Ideation: Passive and fleeting thoughts but no plan Plan Formed: No Patient has means to carry out plan: No  Homicidal Ideation: No Plan Formed: No Patient has means to carry out plan: No  Review of Systems: Psychiatric: Agitation: Yes Hallucination: No Depressed Mood: Yes Insomnia: No Hypersomnia: No Altered Concentration: No Feels Worthless: No Grandiose Ideas: Yes Belief In Special Powers: No New/Increased Substance Abuse: No Compulsions: No  Neurologic: Headache: No Seizure: No Paresthesias: No  Past Medical Family, Social History:  Patient has hyperlipidemia, GERD.  He used to have high creatinine however since he is not taking lithium his creatinine is stable.  His last BUN is 40 and creatinine 1.19 which was drawn in May 2015 . He see Dr. Marina Gravelichard Fox.  His primary care physician is [facility].    Outpatient Encounter Prescriptions as of 11/21/2014  Medication Sig  . benztropine (COGENTIN) 1 MG tablet TAKE 1 TABLET BY MOUTH 2 TIMES DAILY  . buPROPion (WELLBUTRIN XL) 300 MG 24 hr tablet TAKE 1 TABLET BY MOUTH EVERY DAY  . divalproex (DEPAKOTE) 500 MG DR tablet Take 1 tablet (500 mg total) by mouth at bedtime.  Marland Kitchen. OLANZapine (ZYPREXA) 20 MG tablet Take 1 tablet (20 mg total) by mouth at bedtime.  Marland Kitchen. omega-3 acid ethyl esters (LOVAZA) 1 G capsule Take 1 g by mouth 2 (two) times daily.  Marland Kitchen. omeprazole (PRILOSEC) 20 MG capsule Take 20 mg by mouth daily.  Marland Kitchen. terazosin (HYTRIN) 1 MG capsule Take 1 mg by mouth at bedtime.  . [DISCONTINUED] benztropine (COGENTIN) 1 MG tablet TAKE 1 TABLET BY MOUTH 2 TIMES DAILY  . [DISCONTINUED] buPROPion (WELLBUTRIN XL) 300 MG 24 hr tablet TAKE 1 TABLET BY MOUTH EVERY DAY  . [DISCONTINUED] divalproex (DEPAKOTE) 250 MG DR tablet Take 1 tablet (250 mg total) by mouth at bedtime.  . [DISCONTINUED] OLANZapine (ZYPREXA) 20 MG tablet Take 1 tablet (20 mg total) by mouth at bedtime.    Past Psychiatric History/Hospitalization(s): Patient has history of multiple psychiatric hospitalization.  [facility] has [facility].  He has history of taking overdose on Risperdal.  He has history of psychosis, mania and severe depression.  He has been intensive outpatient program.  [facility]  In the past he has taken Abilify, Seroquel, Strattera, lithium, Adderall, Celexa however he did very well on Zyprexa and Wellbutrin.  The patient has history of heavy drinking and using drugs. Anxiety: No Bipolar Disorder: Yes Depression: Yes Mania: Yes Psychosis: Yes Schizophrenia: No Personality Disorder: No Hospitalization for psychiatric illness: Yes History of Electroconvulsive Shock Therapy: No Prior Suicide Attempts: Yes  Physical Exam: Constitutional:  BP 114/71 mmHg  Pulse  75  Ht 5' 11.75" (1.822 m)  Wt 203 lb 9.6 oz (92.352 kg)  BMI 27.82 kg/m2  General Appearance: alert, oriented, no acute distress and well nourished  Musculoskeletal: Strength & Muscle Tone: within normal limits Gait & Station: normal Patient leans: N/A  Mental status examination. Patient is casually dressed and fairly groomed.  He appears anxious and nervous.  His thought processes slow and at times he had thought blocking.  He endorsed passive and fleeting suicidal thoughts but denies any plan or any intent.  He denies any paranoia or any delusion.  His psychomotor activity is slow.  His fund of knowledge is average.  His attention and concentration is fair.  He described his mood sad and irritable and his affect is flat.  He has no tremors, shakes or any EPS.  He is alert and oriented 3.  His insight judgment and impulse control is okay.  Established Problem, Stable/Improving (1), Review of Psycho-Social Stressors (1), Established Problem, Worsening (2), Review of Last Therapy Session (1), Review of Medication Regimen & Side Effects (2) and Review of New Medication or Change in Dosage (2)  Assessment: Axis I: Bipolar disorder with psychotic features, alcohol abuse in remission, cannabis abuse in remission  Axis II: Deferred  Axis III: Hyperlipidemia, GERD  Axis IV: Mild  Axis V: 50-55   Plan:  Patient is slowly decompensating.  I would increase Depakote 500 mg at bedtime and continue Zyprexa 20 mg , Cogentin 1 mg twice a day and Wellbutrin XL 300 mg daily.  Discussed medication side effects and benefits.  Recommended to see [name] more frequently .  We will consider blood work on his next appointment.  Follow-up in 4 weeks.  Time spent 25 minutes.  More than 50% of the time spent in psychoeducation, counseling and coordination of care.  Discuss safety plan that anytime having active suicidal thoughts or homicidal thoughts then patient need to call 911 or go to the local  emergency room.  Aizley Stenseth T., MD 11/21/2014

## 2014-11-23 DIAGNOSIS — H109 Unspecified conjunctivitis: Secondary | ICD-10-CM | POA: Diagnosis not present

## 2014-12-30 ENCOUNTER — Encounter (HOSPITAL_COMMUNITY): Payer: Self-pay | Admitting: Psychiatry

## 2014-12-30 ENCOUNTER — Ambulatory Visit (INDEPENDENT_AMBULATORY_CARE_PROVIDER_SITE_OTHER): Payer: 59 | Admitting: Psychiatry

## 2014-12-30 VITALS — BP 114/75 | HR 75 | Ht 71.75 in | Wt 206.4 lb

## 2014-12-30 DIAGNOSIS — F1221 Cannabis dependence, in remission: Secondary | ICD-10-CM

## 2014-12-30 DIAGNOSIS — F319 Bipolar disorder, unspecified: Secondary | ICD-10-CM | POA: Diagnosis not present

## 2014-12-30 DIAGNOSIS — F1099 Alcohol use, unspecified with unspecified alcohol-induced disorder: Secondary | ICD-10-CM

## 2014-12-30 DIAGNOSIS — Z79899 Other long term (current) drug therapy: Secondary | ICD-10-CM

## 2014-12-30 DIAGNOSIS — F3131 Bipolar disorder, current episode depressed, mild: Secondary | ICD-10-CM

## 2014-12-30 MED ORDER — DIVALPROEX SODIUM 250 MG PO DR TAB: DELAYED_RELEASE_TABLET | ORAL | Status: DC

## 2014-12-30 NOTE — Progress Notes (Signed)
[facility] [id number] Progress Note  Rhona LeavensClifford D Sherburne [id number] 39 y.o.  12/30/2014 10:22 AM  Chief Complaint:  I see some improvement with Depakote but he is still feel irritable and angry .  I still have paranoia and grandiosity.         History of Present Illness: Thomas Woodward came for his followup appointment.  On his last visit we increased Depakote 500 at bedtime.  He is tolerating and he noticed some improvement in his irritability but he still has moments of frustration, and mood swings.  He endorse since Depakote increased he has no suicidal thoughts.  However he continued to feel grandiosity and paranoid delusion.  Sometime he believes God has given him extra power and he embarrassed to talk about with his family.  He also endorsed some time he goes to woods to pray because he does not want any distraction.  However he realized that it could be his imagination and it is not true.  He is going to church regularly and appears preoccupied with religion.  He is trying to keep a distance from his brother because he does not want to argue with him.  He is seeing Providence CrosbyAlan Wilson for counseling.  He denies any suicidal parts or homicidal thought.  He denies any side effects including any shakes, tremors or any EPS.  His appetite is okay.  His vitals are stable.  Patient lives with his mother and his brother.  He has no children. He continues to work at [employer].    Suicidal Ideation: No Plan Formed: No Patient has means to carry out plan: No  Homicidal Ideation: No Plan Formed: No Patient has means to carry out plan: No  Review of Systems  Constitutional: Negative.   Skin: Negative for itching and rash.  Psychiatric/Behavioral: Negative for suicidal ideas and substance abuse. The patient is nervous/anxious.        Grandiosity    Psychiatric: Agitation: Yes Hallucination: No Depressed Mood: Yes Insomnia: No Hypersomnia: No Altered Concentration: No Feels Worthless:  No Grandiose Ideas: Yes Belief In Special Powers: No New/Increased Substance Abuse: No Compulsions: No  Neurologic: Headache: No Seizure: No Paresthesias: No  Past Medical Family, Social History:  Patient has hyperlipidemia, GERD.  He used to have high creatinine however since he is not taking lithium his creatinine is stable.  His last BUN is 40 and creatinine 1.19 which was drawn in May 2015 . He see Dr. Marina Gravelichard Fox.  His primary care physician is Pearson [facility]James Kim at [facility].   Outpatient Encounter Prescriptions as of 12/30/2014  Medication Sig  . benztropine (COGENTIN) 1 MG tablet TAKE 1 TABLET BY MOUTH 2 TIMES DAILY  . buPROPion (WELLBUTRIN XL) 300 MG 24 hr tablet TAKE 1 TABLET BY MOUTH EVERY DAY  . divalproex (DEPAKOTE) 250 MG DR tablet Take 3 tablets at bed time  . OLANZapine (ZYPREXA) 20 MG tablet Take 1 tablet (20 mg total) by mouth at bedtime.  Marland Kitchen. omega-3 acid ethyl esters (LOVAZA) 1 G capsule Take 1 g by mouth 2 (two) times daily.  Marland Kitchen. omeprazole (PRILOSEC) 20 MG capsule Take 20 mg by mouth daily.  Marland Kitchen. terazosin (HYTRIN) 1 MG capsule Take 1 mg by mouth at bedtime.  . [DISCONTINUED] divalproex (DEPAKOTE) 500 MG DR tablet Take 1 tablet (500 mg total) by mouth at bedtime.    Past Psychiatric History/Hospitalization(s): Patient has history of multiple psychiatric hospitalization.  [facility] has [facility].  He has history of taking overdose on Risperdal.  He has history of psychosis, mania and severe depression.  He has been intensive outpatient program.  [facility]  In the past he has taken Abilify, Seroquel, Strattera, lithium, Adderall, Celexa however he did very well on Zyprexa and Wellbutrin.  The patient has history of heavy drinking and using drugs. Anxiety: No Bipolar Disorder: Yes Depression: Yes Mania: Yes Psychosis: Yes Schizophrenia: No Personality  Disorder: No Hospitalization for psychiatric illness: Yes History of Electroconvulsive Shock Therapy: No Prior Suicide Attempts: Yes  Physical Exam: Constitutional:  BP 114/75 mmHg  Pulse 75  Ht 5' 11.75" (1.822 m)  Wt 206 lb 6.4 oz (93.622 kg)  BMI 28.20 kg/m2  General Appearance: alert, oriented, no acute distress and well nourished  Musculoskeletal: Strength & Muscle Tone: within normal limits Gait & Station: normal Patient leans: N/A  Mental status examination. Patient is casually dressed and fairly groomed.  He appears anxious and maintained fair eye contact.  He described his mood anxious and his affect is flat.  His thought processes slow and at times he had thought blocking.  He denies any active or passive suicidal parts or homicidal thought.  He endorse paranoia and grandiosity and sometimes Aleve guarded is giving him messages but he also understands that it is in his imagination. His psychomotor activity is slow.  His fund of knowledge is average.  His attention and concentration is fair.  He has no tremors or any EPS. He is alert and oriented 3.  His insight judgment and impulse control is okay.  Established Problem, Stable/Improving (1), Review of Psycho-Social Stressors (1), Review or order clinical lab tests (1), Established Problem, Worsening (2), Review of Last Therapy Session (1), Review of Medication Regimen & Side Effects (2) and Review of New Medication or Change in Dosage (2)  Assessment: Axis I: Bipolar disorder with psychotic features, alcohol abuse in remission, cannabis abuse in remission  Axis II: Deferred  Axis III: Hyperlipidemia, GERD, history of increased creatinine   Plan:  I review his symptoms and his current medication.  Patient is still have grandiosity and manic symptoms.  He is not sleeping well.  I increased Depakote 750 mg at bedtime .  Continue Zyprexa 20 mg at bedtime, Cogentin 1 mg twice a day and Wellbutrin XL 300 mg daily.  In the past  he had tried higher dose of Zyprexa however he does not feel it was helpful and his insurance does not improved 30 mg.  I also offered Haldol but patient declined.  In the past she has taken Abilify, Seroquel with limited response.  He had high creatinine with lithium.  The patient does not see any improvement with Depakote would consider Tegretol.  Discussed medication side effects and benefits.  Encouraged to see [name] for counseling.  I will do blood work including Depakote level, hemoglobin A1c and metabolic panel.  Follow-up in 4 weeks. Time spent 25 minutes.  More than 50% of the time spent in psychoeducation, counseling and coordination of care.  Discuss safety plan that anytime having active suicidal thoughts or homicidal thoughts then patient need to call 911 or go to the local emergency room.  Syndi Pua T., MD 12/30/2014

## 2015-01-12 DIAGNOSIS — Z79899 Other long term (current) drug therapy: Secondary | ICD-10-CM | POA: Diagnosis not present

## 2015-01-12 DIAGNOSIS — F319 Bipolar disorder, unspecified: Secondary | ICD-10-CM | POA: Diagnosis not present

## 2015-01-13 LAB — COMPLETE METABOLIC PANEL WITH GFR
ALBUMIN: 4.3 g/dL (ref 3.5–5.2)
ALT: 19 U/L (ref 0–53)
AST: 16 U/L (ref 0–37)
Alkaline Phosphatase: 46 U/L (ref 39–117)
BUN: 23 mg/dL (ref 6–23)
CHLORIDE: 104 meq/L (ref 96–112)
CO2: 25 meq/L (ref 19–32)
Calcium: 9.3 mg/dL (ref 8.4–10.5)
Creat: 1.31 mg/dL (ref 0.50–1.35)
GFR, EST NON AFRICAN AMERICAN: 68 mL/min
GFR, Est African American: 79 mL/min
GLUCOSE: 77 mg/dL (ref 70–99)
Potassium: 4.3 mEq/L (ref 3.5–5.3)
Sodium: 139 mEq/L (ref 135–145)
TOTAL PROTEIN: 6.3 g/dL (ref 6.0–8.3)
Total Bilirubin: 0.5 mg/dL (ref 0.2–1.2)

## 2015-01-13 LAB — HEMOGLOBIN A1C
Hgb A1c MFr Bld: 5.8 % — ABNORMAL HIGH (ref <5.7)
MEAN PLASMA GLUCOSE: 120 mg/dL — AB (ref <117)

## 2015-01-13 LAB — CBC WITH DIFFERENTIAL/PLATELET
BASOS ABS: 0 10*3/uL (ref 0.0–0.1)
BASOS PCT: 0 % (ref 0–1)
Eosinophils Absolute: 0.4 10*3/uL (ref 0.0–0.7)
Eosinophils Relative: 6 % — ABNORMAL HIGH (ref 0–5)
HEMATOCRIT: 39 % (ref 39.0–52.0)
Hemoglobin: 12.9 g/dL — ABNORMAL LOW (ref 13.0–17.0)
LYMPHS ABS: 1.8 10*3/uL (ref 0.7–4.0)
LYMPHS PCT: 28 % (ref 12–46)
MCH: 29.7 pg (ref 26.0–34.0)
MCHC: 33.1 g/dL (ref 30.0–36.0)
MCV: 89.9 fL (ref 78.0–100.0)
MONO ABS: 0.4 10*3/uL (ref 0.1–1.0)
MONOS PCT: 6 % (ref 3–12)
MPV: 10.1 fL (ref 8.6–12.4)
NEUTROS ABS: 3.8 10*3/uL (ref 1.7–7.7)
Neutrophils Relative %: 60 % (ref 43–77)
Platelets: 211 10*3/uL (ref 150–400)
RBC: 4.34 MIL/uL (ref 4.22–5.81)
RDW: 13.8 % (ref 11.5–15.5)
WBC: 6.4 10*3/uL (ref 4.0–10.5)

## 2015-01-13 LAB — VALPROIC ACID LEVEL: Valproic Acid Lvl: 33.3 ug/mL — ABNORMAL LOW (ref 50.0–100.0)

## 2015-01-20 ENCOUNTER — Encounter (HOSPITAL_COMMUNITY): Payer: Self-pay | Admitting: Psychiatry

## 2015-01-20 ENCOUNTER — Ambulatory Visit (INDEPENDENT_AMBULATORY_CARE_PROVIDER_SITE_OTHER): Payer: 59 | Admitting: Psychiatry

## 2015-01-20 VITALS — BP 110/72 | HR 65 | Ht 71.75 in | Wt 205.6 lb

## 2015-01-20 DIAGNOSIS — F319 Bipolar disorder, unspecified: Secondary | ICD-10-CM | POA: Diagnosis not present

## 2015-01-20 DIAGNOSIS — F1021 Alcohol dependence, in remission: Secondary | ICD-10-CM | POA: Diagnosis not present

## 2015-01-20 DIAGNOSIS — F1221 Cannabis dependence, in remission: Secondary | ICD-10-CM | POA: Diagnosis not present

## 2015-01-20 MED ORDER — DIVALPROEX SODIUM 500 MG PO DR TAB: 1000.0000 mg | DELAYED_RELEASE_TABLET | Freq: Every day | ORAL | Status: DC

## 2015-01-20 NOTE — Progress Notes (Signed)
[place] Progress Note  Thomas Woodward [id number] 39 y.o.  01/20/2015 10:02 AM  Chief Complaint:  I like Depakote but I cannot sleep very well.  I still have irritability and paranoia.           History of Present Illness: Thomas Woodward came for his followup appointment.  On his last visit we increased Depakote 750 mg at bedtime.  He is taking his medication as prescribed and he has seen some improvement in his depression and frustration but he still have poor sleep and paranoia.  He continue to be preoccupied with his religious thoughts and some time he goes in woods to pray.  He continued to believe that God has given extra power but now he realizes that some time this may be his imagination.  He is going to church regularly.  Since Depakote increase he is more social and active.  He started going to [employer] which he had to stop in the past.  He is seeing Thomas Woodward for counseling.  He had blood work which shows Depakote level 33.  His creatinine is 1.3 when and stable and his 11 A1c is 5.8.  He has no tremors, shakes or any extrapyramidal side effects.  He is taking Zyprexa, Wellbutrin and Cogentin without any issues.  Patient lives with his mother and his brother.  He denies any recent aggression or argument with his brother.  Patient denies drinking or using any illegal substances.  His appetite is okay.  His vitals are stable. He has no children. He continues to work at [employer]    Suicidal Ideation: No Plan Formed: No Patient has means to carry out plan: No  Homicidal Ideation: No Plan Formed: No Patient has means to carry out plan: No  Review of Systems  Constitutional: Negative.   Skin: Negative for itching and rash.  Psychiatric/Behavioral: Positive for hallucinations. Negative for suicidal ideas. The patient is nervous/anxious and has insomnia.     Psychiatric: Agitation: Yes Hallucination: No Depressed Mood: Yes Insomnia: No Hypersomnia:  No Altered Concentration: No Feels Worthless: No Grandiose Ideas: Yes Belief In Special Powers: No New/Increased Substance Abuse: No Compulsions: No  Neurologic: Headache: No Seizure: No Paresthesias: No  Past Medical Family, Social History:  Patient has hyperlipidemia, GERD.  He used to have high creatinine however since he is not taking lithium his creatinine is stable.  His last BUN is 40 and creatinine 1.19 which was drawn in May 2015 . He see Dr. Salem Woodward.  His primary care physician is Thomas Woodward at [facility]   Outpatient Encounter Prescriptions as of 01/20/2015  Medication Sig  . benztropine (COGENTIN) 1 MG tablet TAKE 1 TABLET BY MOUTH 2 TIMES DAILY  . buPROPion (WELLBUTRIN XL) 300 MG 24 hr tablet TAKE 1 TABLET BY MOUTH EVERY DAY  . OLANZapine (ZYPREXA) 20 MG tablet Take 1 tablet (20 mg total) by mouth at bedtime.  Marland Kitchen omega-3 acid ethyl esters (LOVAZA) 1 G capsule Take 1 g by mouth 2 (two) times daily.  Marland Kitchen omeprazole (PRILOSEC) 20 MG capsule Take 20 mg by mouth daily.  Marland Kitchen terazosin (HYTRIN) 1 MG capsule Take 1 mg by mouth at bedtime.  . [DISCONTINUED] divalproex (DEPAKOTE) 250 MG DR tablet Take 3 tablets at bed time  . divalproex (DEPAKOTE) 500 MG DR tablet Take 2 tablets (1,000 mg total) by mouth at bedtime.  . [DISCONTINUED] divalproex (DEPAKOTE) 500 MG DR tablet     Past Psychiatric History/Hospitalization(s): Patient  has history of multiple psychiatric hospitalization.  He has several admission in 1994-1995 at [facility] and at [place].  He has history of taking overdose on Risperdal.  He has history of psychosis, mania and severe depression.  He has been intensive outpatient program.  [facility] at [facility] was in 2006.  In the past he has taken Abilify, Seroquel, Strattera, lithium, Adderall, Celexa however he did very well on Zyprexa and Wellbutrin.  The patient has history of heavy drinking and using  drugs. Anxiety: No Bipolar Disorder: Yes Depression: Yes Mania: Yes Psychosis: Yes Schizophrenia: No Personality Disorder: No Hospitalization for psychiatric illness: Yes History of Electroconvulsive Shock Therapy: No Prior Suicide Attempts: Yes  Physical Exam: Constitutional:  BP 110/72 mmHg  Pulse 65  Ht 5' 11.75" (1.822 m)  Wt 205 lb 9.6 oz (93.26 kg)  BMI 28.09 kg/m2   Recent Results (from the past 2160 hour(s))  Valproic acid level     Status: Abnormal   Collection Time: 01/12/15  4:23 PM  Result Value Ref Range   Valproic Acid Lvl 33.3 (L) 50.0 - 100.0 ug/mL  Hemoglobin A1c     Status: Abnormal   Collection Time: 01/12/15  4:23 PM  Result Value Ref Range   Hgb A1c MFr Bld 5.8 (H) <5.7 %    Comment:                                                                        According to the ADA Clinical Practice Recommendations for 2011, when HbA1c is used as a screening test:     >=6.5%   Diagnostic of Diabetes Mellitus            (if abnormal result is confirmed)   5.7-6.4%   Increased risk of developing Diabetes Mellitus   References:Diagnosis and Classification of Diabetes Mellitus,Diabetes GYFV,4944,96(PRFFM 1):S62-S69 and Standards of Medical Care in         Diabetes - 2011,Diabetes Care,2011,34 (Suppl 1):S11-S61.      Mean Plasma Glucose 120 (H) <117 mg/dL  COMPLETE METABOLIC PANEL WITH GFR     Status: None   Collection Time: 01/12/15  4:23 PM  Result Value Ref Range   Sodium 139 135 - 145 mEq/L   Potassium 4.3 3.5 - 5.3 mEq/L   Chloride 104 96 - 112 mEq/L   CO2 25 19 - 32 mEq/L   Glucose, Bld 77 70 - 99 mg/dL   BUN 23 6 - 23 mg/dL   Creat 1.31 0.50 - 1.35 mg/dL   Total Bilirubin 0.5 0.2 - 1.2 mg/dL   Alkaline Phosphatase 46 39 - 117 U/L   AST 16 0 - 37 U/L   ALT 19 0 - 53 U/L   Total Protein 6.3 6.0 - 8.3 g/dL   Albumin 4.3 3.5 - 5.2 g/dL   Calcium 9.3 8.4 - 10.5 mg/dL   GFR, Est African American 79 mL/min   GFR, Est Non African American 68 mL/min     Comment:   The estimated GFR is a calculation valid for adults (>=77 years old) that uses the CKD-EPI algorithm to adjust for age and sex. It is   not to be used for children, pregnant women, hospitalized patients,  patients on dialysis, or with rapidly changing kidney function. According to the NKDEP, eGFR >89 is normal, 60-89 shows mild impairment, 30-59 shows moderate impairment, 15-29 shows severe impairment and <15 is ESRD.     CBC with Differential/Platelet     Status: Abnormal   Collection Time: 01/12/15  4:23 PM  Result Value Ref Range   WBC 6.4 4.0 - 10.5 K/uL   RBC 4.34 4.22 - 5.81 MIL/uL   Hemoglobin 12.9 (L) 13.0 - 17.0 g/dL   HCT 39.0 39.0 - 52.0 %   MCV 89.9 78.0 - 100.0 fL   MCH 29.7 26.0 - 34.0 pg   MCHC 33.1 30.0 - 36.0 g/dL   RDW 13.8 11.5 - 15.5 %   Platelets 211 150 - 400 K/uL   MPV 10.1 8.6 - 12.4 fL   Neutrophils Relative % 60 43 - 77 %   Neutro Abs 3.8 1.7 - 7.7 K/uL   Lymphocytes Relative 28 12 - 46 %   Lymphs Abs 1.8 0.7 - 4.0 K/uL   Monocytes Relative 6 3 - 12 %   Monocytes Absolute 0.4 0.1 - 1.0 K/uL   Eosinophils Relative 6 (H) 0 - 5 %   Eosinophils Absolute 0.4 0.0 - 0.7 K/uL   Basophils Relative 0 0 - 1 %   Basophils Absolute 0.0 0.0 - 0.1 K/uL   Smear Review Criteria for review not met     General Appearance: alert, oriented, no acute distress and well nourished  Musculoskeletal: Strength & Muscle Tone: within normal limits Gait & Station: normal Patient leans: N/A  Mental status examination. Patient is casually dressed and fairly groomed.  He appears shy, anxious and maintain fair eye contact.  He described his mood anxious and his affect is flat.  His thought processes slow and at times he had thought blocking.  He denies any active or passive suicidal parts or homicidal thought.  He endorse paranoia and grandiosity . His psychomotor activity is slow.  His fund of knowledge is average.  His attention and concentration is fair.  He has  no tremors or any EPS. He is alert and oriented 3.  His insight judgment and impulse control is okay.  Established Problem, Stable/Improving (1), Review of Psycho-Social Stressors (1), Review or order clinical lab tests (1), Review of Last Therapy Session (1), Review of Medication Regimen & Side Effects (2) and Review of New Medication or Change in Dosage (2)  Assessment: Axis I: Bipolar disorder with psychotic features, alcohol abuse in remission, cannabis abuse in remission  Axis II: Deferred  Axis III: Hyperlipidemia, GERD, history of increased creatinine   Plan:  I review his blood work results and his Depakote level.  Recommended to increase Depakote 1000 mg at bedtime.  He is feeling some improvement in his depression and irritability however he continues to have poor sleep.  Patient denies any side effects.  I will continue Zyprexa 20 mg at bedtime, Cogentin 1 mg twice a day and Wellbutrin XL 300 mg daily.  We will defer changing his medication at this time .  Discussed medication side effects and benefits.  Encouraged to see Thomas Woodward for counseling.  Recommended to call us back if he has any question or any concern or if he feel worsening of the symptom.  Follow-up in 4 weeks. Time spent 25 minutes.  More than 50% of the time spent in psychoeducation, counseling and coordination of care.  Discuss safety plan that anytime having active suicidal thoughts or homicidal  thoughts then patient need to call 911 or go to the local emergency room.  Michol Emory T., MD 01/20/2015

## 2015-02-16 ENCOUNTER — Other Ambulatory Visit (HOSPITAL_COMMUNITY): Payer: Self-pay | Admitting: Psychiatry

## 2015-02-16 NOTE — Telephone Encounter (Signed)
Called patient to see if increase of Depakote has helped and if he needs a refill.  Patient stated increase of Depakote is working great, feels better. I moved patient's appointment from 03-06-15 to 02-24-15 because Dr. Lolly MustacheArfeen needed patient to follow up in one month.. Patient stated he has enough Depakote to get to appointment day.

## 2015-02-20 ENCOUNTER — Ambulatory Visit (HOSPITAL_COMMUNITY): Payer: Self-pay | Admitting: Psychiatry

## 2015-02-22 ENCOUNTER — Other Ambulatory Visit (HOSPITAL_COMMUNITY): Payer: Self-pay | Admitting: Psychiatry

## 2015-02-22 NOTE — Telephone Encounter (Signed)
Left patient a message to question if he had enough Cogentin and other prescribed medications from Dr. Lolly MustacheArfeen until rescheduled appointment now set for 02/24/15.  Requested patient call back to inform if new orders were needed?

## 2015-02-24 ENCOUNTER — Encounter (HOSPITAL_COMMUNITY): Payer: Self-pay | Admitting: Psychiatry

## 2015-02-24 ENCOUNTER — Ambulatory Visit (INDEPENDENT_AMBULATORY_CARE_PROVIDER_SITE_OTHER): Payer: 59 | Admitting: Psychiatry

## 2015-02-24 VITALS — BP 110/71 | HR 70 | Ht 71.75 in | Wt 208.4 lb

## 2015-02-24 DIAGNOSIS — F1021 Alcohol dependence, in remission: Secondary | ICD-10-CM

## 2015-02-24 DIAGNOSIS — F1221 Cannabis dependence, in remission: Secondary | ICD-10-CM | POA: Diagnosis not present

## 2015-02-24 DIAGNOSIS — F3131 Bipolar disorder, current episode depressed, mild: Secondary | ICD-10-CM

## 2015-02-24 DIAGNOSIS — F319 Bipolar disorder, unspecified: Secondary | ICD-10-CM

## 2015-02-24 MED ORDER — BUPROPION HCL ER (XL) 300 MG PO TB24: ORAL_TABLET | ORAL | Status: DC

## 2015-02-24 MED ORDER — OLANZAPINE 20 MG PO TABS: 20.0000 mg | ORAL_TABLET | Freq: Every day | ORAL | Status: DC

## 2015-02-24 MED ORDER — BENZTROPINE MESYLATE 1 MG PO TABS: ORAL_TABLET | ORAL | Status: DC

## 2015-02-24 MED ORDER — DIVALPROEX SODIUM 500 MG PO DR TAB: 1000.0000 mg | DELAYED_RELEASE_TABLET | Freq: Every day | ORAL | Status: DC

## 2015-02-24 NOTE — Telephone Encounter (Signed)
Cogentin refill declined at this time as patient is scheduled for an appointment on 02/24/15 and informed front desk staff he had medication until appointment.

## 2015-02-24 NOTE — Progress Notes (Signed)
[place] Progress Note  Thomas Woodward [id number] 39 y.o.  02/24/2015 11:55 AM  Chief Complaint:  I am feeling better.  I'm sleeping better.            History of Present Illness: Thomas Woodward came for his followup appointment.  He is taking Depakote 1000 mg at bedtime.  He seen much improvement in his sleep, paranoia, irritability and hallucinations.  He still has issues with his brother and some time people from church but there has been no recent agitation anger or any mood swings.  He feels finally his medicine is helping his mood .  He is still preoccupied with his religious thoughts but there has been no recent incident that requires when he usually get upset he goes in the woods to pray.  He is going to his work and he likes his job.  He has no tremors or shakes.  He denies any feeling of hopelessness or worthlessness.  His depression is much better with the medication.  Patient is living with his mother and his brother.  He denies drinking or using any illegal substances.  His appetite is okay.  His vitals are stable.  He has no children.  He works at [employer]   patient is seeing therapist Thomas Woodward.    Suicidal Ideation: No Plan Formed: No Patient has means to carry out plan: No  Homicidal Ideation: No Plan Formed: No Patient has means to carry out plan: No  ROS  Psychiatric: Agitation: No Hallucination: No Depressed Mood: No Insomnia: No Hypersomnia: No Altered Concentration: No Feels Worthless: No Grandiose Ideas: Yes Belief In Special Powers: No New/Increased Substance Abuse: No Compulsions: No  Neurologic: Headache: No Seizure: No Paresthesias: No  Past Medical Family, Social History:  Patient has hyperlipidemia, GERD.  He used to have high creatinine however since he is not taking lithium his creatinine is stable.  His last BUN is 40 and creatinine 1.19 which was drawn in May 2015 . He see Dr. Salem Woodward.  His primary care physician is  Thomas Woodward at [facility].   Outpatient Encounter Prescriptions as of 02/24/2015  Medication Sig  . benztropine (COGENTIN) 1 MG tablet TAKE 1 TABLET BY MOUTH 2 TIMES DAILY  . buPROPion (WELLBUTRIN XL) 300 MG 24 hr tablet TAKE 1 TABLET BY MOUTH EVERY DAY  . divalproex (DEPAKOTE) 500 MG DR tablet Take 2 tablets (1,000 mg total) by mouth at bedtime.  Marland Kitchen OLANZapine (ZYPREXA) 20 MG tablet Take 1 tablet (20 mg total) by mouth at bedtime.  Marland Kitchen omega-3 acid ethyl esters (LOVAZA) 1 G capsule Take 1 g by mouth 2 (two) times daily.  Marland Kitchen omeprazole (PRILOSEC) 20 MG capsule Take 20 mg by mouth daily.  Marland Kitchen terazosin (HYTRIN) 1 MG capsule Take 1 mg by mouth at bedtime.  . [DISCONTINUED] benztropine (COGENTIN) 1 MG tablet TAKE 1 TABLET BY MOUTH 2 TIMES DAILY  . [DISCONTINUED] buPROPion (WELLBUTRIN XL) 300 MG 24 hr tablet TAKE 1 TABLET BY MOUTH EVERY DAY  . [DISCONTINUED] divalproex (DEPAKOTE) 500 MG DR tablet Take 2 tablets (1,000 mg total) by mouth at bedtime.  . [DISCONTINUED] OLANZapine (ZYPREXA) 20 MG tablet Take 1 tablet (20 mg total) by mouth at bedtime.    Past Psychiatric History/Hospitalization(s): Patient has history of multiple psychiatric hospitalization.  He has several admission in 1994-1995 at [facility] and at [place].  He has history of taking overdose on Risperdal.  He has history of psychosis, mania and severe  depression.  He has done intensive outpatient program.  His last psychiatric hospitalization at [facility] [place] was in 2006.  In the past he has taken Abilify, Seroquel, Strattera, lithium, Adderall, Celexa however he did very well on Zyprexa and Wellbutrin.  The patient has history of heavy drinking and using drugs. Anxiety: No Bipolar Disorder: Yes Depression: Yes Mania: Yes Psychosis: Yes Schizophrenia: No Personality Disorder: No Hospitalization for psychiatric illness: Yes History of Electroconvulsive Shock Therapy: No Prior Suicide Attempts: Yes  Physical  Exam: Constitutional:  There were no vitals taken for this visit.   Recent Results (from the past 2160 hour(s))  Valproic acid level     Status: Abnormal   Collection Time: 01/12/15  4:23 PM  Result Value Ref Range   Valproic Acid Lvl 33.3 (L) 50.0 - 100.0 ug/mL  Hemoglobin A1c     Status: Abnormal   Collection Time: 01/12/15  4:23 PM  Result Value Ref Range   Hgb A1c MFr Bld 5.8 (H) <5.7 %    Comment:                                                                        According to the ADA Clinical Practice Recommendations for 2011, when HbA1c is used as a screening test:     >=6.5%   Diagnostic of Diabetes Mellitus            (if abnormal result is confirmed)   5.7-6.4%   Increased risk of developing Diabetes Mellitus   References:Diagnosis and Classification of Diabetes Mellitus,Diabetes QBHA,1937,90(WIOXB 1):S62-S69 and Standards of Medical Care in         Diabetes - 2011,Diabetes Care,2011,34 (Suppl 1):S11-S61.      Mean Plasma Glucose 120 (H) <117 mg/dL  COMPLETE METABOLIC PANEL WITH GFR     Status: None   Collection Time: 01/12/15  4:23 PM  Result Value Ref Range   Sodium 139 135 - 145 mEq/L   Potassium 4.3 3.5 - 5.3 mEq/L   Chloride 104 96 - 112 mEq/L   CO2 25 19 - 32 mEq/L   Glucose, Bld 77 70 - 99 mg/dL   BUN 23 6 - 23 mg/dL   Creat 1.31 0.50 - 1.35 mg/dL   Total Bilirubin 0.5 0.2 - 1.2 mg/dL   Alkaline Phosphatase 46 39 - 117 U/L   AST 16 0 - 37 U/L   ALT 19 0 - 53 U/L   Total Protein 6.3 6.0 - 8.3 g/dL   Albumin 4.3 3.5 - 5.2 g/dL   Calcium 9.3 8.4 - 10.5 mg/dL   GFR, Est African American 79 mL/min   GFR, Est Non African American 68 mL/min    Comment:   The estimated GFR is a calculation valid for adults (>=4 years old) that uses the CKD-EPI algorithm to adjust for age and sex. It is   not to be used for children, pregnant women, hospitalized patients,    patients on dialysis, or with rapidly changing kidney function. According to the NKDEP, eGFR >89  is normal, 60-89 shows mild impairment, 30-59 shows moderate impairment, 15-29 shows severe impairment and <15 is ESRD.     CBC with Differential/Platelet     Status: Abnormal   Collection Time:  01/12/15  4:23 PM  Result Value Ref Range   WBC 6.4 4.0 - 10.5 K/uL   RBC 4.34 4.22 - 5.81 MIL/uL   Hemoglobin 12.9 (L) 13.0 - 17.0 g/dL   HCT 39.0 39.0 - 52.0 %   MCV 89.9 78.0 - 100.0 fL   MCH 29.7 26.0 - 34.0 pg   MCHC 33.1 30.0 - 36.0 g/dL   RDW 13.8 11.5 - 15.5 %   Platelets 211 150 - 400 K/uL   MPV 10.1 8.6 - 12.4 fL   Neutrophils Relative % 60 43 - 77 %   Neutro Abs 3.8 1.7 - 7.7 K/uL   Lymphocytes Relative 28 12 - 46 %   Lymphs Abs 1.8 0.7 - 4.0 K/uL   Monocytes Relative 6 3 - 12 %   Monocytes Absolute 0.4 0.1 - 1.0 K/uL   Eosinophils Relative 6 (H) 0 - 5 %   Eosinophils Absolute 0.4 0.0 - 0.7 K/uL   Basophils Relative 0 0 - 1 %   Basophils Absolute 0.0 0.0 - 0.1 K/uL   Smear Review Criteria for review not met     General Appearance: alert, oriented, no acute distress and well nourished  Musculoskeletal: Strength & Muscle Tone: within normal limits Gait & Station: normal Patient leans: N/A  Mental status examination. Patient is casually dressed and fairly groomed.  He appears shy but pleasant.  He maintained fair eye contact.  He described his mood neutral and his affect is flat.  His thought processes slow  but coherent.  He denies any active or passive suicidal parts or homicidal thought.  His psychomotor activity is slow.  His fund of knowledge is average.  His attention and concentration is fair.  He has no tremors or any EPS. He is alert and oriented 3.  His insight judgment and impulse control is okay.  Established Problem, Stable/Improving (1), Review of Psycho-Social Stressors (1), Review of Last Therapy Session (1) and Review of Medication Regimen & Side Effects (2)  Assessment: Axis I: Bipolar disorder with psychotic features, alcohol abuse in remission, cannabis  abuse in remission  Axis II: Deferred  Axis III: Hyperlipidemia, GERD, history of increased creatinine   Plan:   patient is doing better on increased Depakote.  He has no side effects.  I will continue Depakote 1000 mg at bedtime, Zyprexa 20 mg at bedtime, Cogentin 1 mg twice a day and Wellbutrin XL 300 mg daily.  Encouraged to see Thomas Woodward for counseling.  Recommended to call us back if he has any question or any concern or if he feel worsening of the symptom.  Follow-up in 12 weeks.  Thales Knipple T., MD 02/24/2015

## 2015-03-06 ENCOUNTER — Ambulatory Visit (HOSPITAL_COMMUNITY): Payer: Self-pay | Admitting: Psychiatry

## 2015-04-06 DIAGNOSIS — Z0000 Encounter for general adult medical examination without abnormal findings: Secondary | ICD-10-CM | POA: Diagnosis not present

## 2015-04-06 DIAGNOSIS — E785 Hyperlipidemia, unspecified: Secondary | ICD-10-CM | POA: Diagnosis not present

## 2015-04-13 DIAGNOSIS — R739 Hyperglycemia, unspecified: Secondary | ICD-10-CM | POA: Diagnosis not present

## 2015-04-13 DIAGNOSIS — E780 Pure hypercholesterolemia: Secondary | ICD-10-CM | POA: Diagnosis not present

## 2015-04-24 DIAGNOSIS — H43393 Other vitreous opacities, bilateral: Secondary | ICD-10-CM | POA: Diagnosis not present

## 2015-05-26 ENCOUNTER — Ambulatory Visit (INDEPENDENT_AMBULATORY_CARE_PROVIDER_SITE_OTHER): Payer: 59 | Admitting: Psychiatry

## 2015-05-26 ENCOUNTER — Encounter (HOSPITAL_COMMUNITY): Payer: Self-pay | Admitting: Psychiatry

## 2015-05-26 VITALS — BP 122/76 | HR 71 | Ht 71.75 in | Wt 213.0 lb

## 2015-05-26 DIAGNOSIS — F3131 Bipolar disorder, current episode depressed, mild: Secondary | ICD-10-CM

## 2015-05-26 DIAGNOSIS — F1221 Cannabis dependence, in remission: Secondary | ICD-10-CM

## 2015-05-26 DIAGNOSIS — F319 Bipolar disorder, unspecified: Secondary | ICD-10-CM | POA: Diagnosis not present

## 2015-05-26 DIAGNOSIS — F29 Unspecified psychosis not due to a substance or known physiological condition: Secondary | ICD-10-CM

## 2015-05-26 DIAGNOSIS — F1021 Alcohol dependence, in remission: Secondary | ICD-10-CM | POA: Diagnosis not present

## 2015-05-26 MED ORDER — OLANZAPINE 20 MG PO TABS: 20.0000 mg | ORAL_TABLET | Freq: Every day | ORAL | Status: DC

## 2015-05-26 MED ORDER — BENZTROPINE MESYLATE 1 MG PO TABS: ORAL_TABLET | ORAL | Status: DC

## 2015-05-26 MED ORDER — BUPROPION HCL ER (XL) 300 MG PO TB24: ORAL_TABLET | ORAL | Status: DC

## 2015-05-26 MED ORDER — DIVALPROEX SODIUM 500 MG PO DR TAB: 1000.0000 mg | DELAYED_RELEASE_TABLET | Freq: Every day | ORAL | Status: DC

## 2015-05-26 NOTE — Progress Notes (Signed)
[facility] [phone] Progress Note  Thomas Woodward [id number] 39 y.o.  05/26/2015 10:59 AM  Chief Complaint:  I am feeling better.  I'm sleeping better.            History of Present Illness: Fellman came for his followup appointment.  He is compliant with the medication and reported no side effects.  He is still have some time imagination, grandiosity but denies any irritability, anger or any severe mood swings.  He is able to sleep good.  He is religiously preoccupied but he still goes to church .  He denies any hallucination or any paranoia.  He reported medicine is keeping him calm and he denies any recent angry episode.  He is getting along good with his brother.  He has no tremors or shakes.  He denies any feeling of hopelessness or worthlessness.  He is seeing therapist [name] every month and he continues to work at [employer].  He denies drinking or using any illegal substances.  His appetite is okay.  His vitals are stable.  However admitted he has gained weight from the last visit because he is not watching his calorie intake.    Suicidal Ideation: No Plan Formed: No Patient has means to carry out plan: No  Homicidal Ideation: No Plan Formed: No Patient has means to carry out plan: No  ROS  Psychiatric: Agitation: No Hallucination: No Depressed Mood: No Insomnia: No Hypersomnia: No Altered Concentration: No Feels Worthless: No Grandiose Ideas: Yes Belief In Special Powers: No New/Increased Substance Abuse: No Compulsions: No  Neurologic: Headache: No Seizure: No Paresthesias: No  Past Medical Family, Social History:  Patient has hyperlipidemia, GERD.  He used to have high creatinine however since he is not taking lithium his creatinine is stable.  His primary care physician is Pearson Grippe at [facility]   Outpatient Encounter Prescriptions as of 05/26/2015  Medication Sig  . benztropine (COGENTIN) 1 MG tablet TAKE 1 TABLET BY  MOUTH 2 TIMES DAILY  . buPROPion (WELLBUTRIN XL) 300 MG 24 hr tablet TAKE 1 TABLET BY MOUTH EVERY DAY  . divalproex (DEPAKOTE) 500 MG DR tablet Take 2 tablets (1,000 mg total) by mouth at bedtime.  Marland Kitchen OLANZapine (ZYPREXA) 20 MG tablet Take 1 tablet (20 mg total) by mouth at bedtime.  Marland Kitchen omega-3 acid ethyl esters (LOVAZA) 1 G capsule Take 1 g by mouth 2 (two) times daily.  Marland Kitchen omeprazole (PRILOSEC) 20 MG capsule Take 20 mg by mouth daily.  Marland Kitchen terazosin (HYTRIN) 1 MG capsule Take 1 mg by mouth at bedtime.  . [DISCONTINUED] benztropine (COGENTIN) 1 MG tablet TAKE 1 TABLET BY MOUTH 2 TIMES DAILY  . [DISCONTINUED] buPROPion (WELLBUTRIN XL) 300 MG 24 hr tablet TAKE 1 TABLET BY MOUTH EVERY DAY  . [DISCONTINUED] divalproex (DEPAKOTE) 500 MG DR tablet Take 2 tablets (1,000 mg total) by mouth at bedtime.  . [DISCONTINUED] OLANZapine (ZYPREXA) 20 MG tablet Take 1 tablet (20 mg total) by mouth at bedtime.   No facility-administered encounter medications on file as of 05/26/2015.    Past Psychiatric History/Hospitalization(s): Patient has history of multiple psychiatric hospitalization.  He has several admission in 1994-1995 at [facility] and at [place].  He has history of taking overdose on Risperdal.  He has history of psychosis, mania and severe depression.  He has done intensive outpatient program.  [facility]  In the past he has taken Abilify, Seroquel, Strattera,  lithium, Adderall, Celexa however he did very well on Zyprexa and Wellbutrin.  The patient has history of heavy drinking and using drugs. Anxiety: No Bipolar Disorder: Yes Depression: Yes Mania: Yes Psychosis: Yes Schizophrenia: No Personality Disorder: No Hospitalization for psychiatric illness: Yes History of Electroconvulsive Shock Therapy: No Prior Suicide Attempts: Yes  Physical Exam: Constitutional:  BP 122/76 mmHg  Pulse 71  Ht 5' 11.75" (1.822 m)  Wt 213 lb  (96.616 kg)  BMI 29.10 kg/m2   No results found for this or any previous visit (from the past 2160 hour(s)).  General Appearance: alert, oriented, no acute distress and well nourished  Musculoskeletal: Strength & Muscle Tone: within normal limits Gait & Station: normal Patient leans: N/A  Mental status examination. Patient is casually dressed and fairly groomed.  He appears shy but pleasant.  He maintained fair eye contact.  He described his mood neutral and his affect is flat.  His thought processes slow  but coherent.  He denies any active or passive suicidal thoughts.  He has grandiosity and sometimes preoccupied with his religious thoughts but he denies any homicidal thoughts.  His psychomotor activity is slow.  His fund of knowledge is average.  His attention and concentration is fair.  He has no tremors or any EPS. He is alert and oriented 3.  His insight judgment and impulse control is okay.  Established Problem, Stable/Improving (1), Review of Psycho-Social Stressors (1), Review of Last Therapy Session (1) and Review of Medication Regimen & Side Effects (2)  Assessment: Axis I: Bipolar disorder with psychotic features, alcohol abuse in remission, cannabis abuse in remission  Axis II: Deferred  Axis III: Hyperlipidemia, GERD, history of increased creatinine   Plan:  Patient is a stable on his current medication.  He has no side effects.  I will continue Depakote 1000 mg at bedtime, Zyprexa 20 mg at bedtime, Wellbutrin XL 300 mg at bedtime and Cogentin 1 mg at bedtime.  Discussed medication side effects and benefits.  Recommended to call us back if he has any question or any concern.  Encouraged to keep appointment with counseling for coping and social skills.  Follow-up in 3 months.  Hamish Banks T., MD 05/26/2015

## 2015-06-13 ENCOUNTER — Other Ambulatory Visit (HOSPITAL_COMMUNITY): Payer: Self-pay | Admitting: Psychiatry

## 2015-08-29 ENCOUNTER — Ambulatory Visit (INDEPENDENT_AMBULATORY_CARE_PROVIDER_SITE_OTHER): Payer: 59 | Admitting: Psychiatry

## 2015-08-29 ENCOUNTER — Encounter (HOSPITAL_COMMUNITY): Payer: Self-pay | Admitting: Psychiatry

## 2015-08-29 VITALS — BP 125/74 | HR 80 | Ht 71.75 in | Wt 213.4 lb

## 2015-08-29 DIAGNOSIS — F1021 Alcohol dependence, in remission: Secondary | ICD-10-CM | POA: Diagnosis not present

## 2015-08-29 DIAGNOSIS — F319 Bipolar disorder, unspecified: Secondary | ICD-10-CM

## 2015-08-29 DIAGNOSIS — F3131 Bipolar disorder, current episode depressed, mild: Secondary | ICD-10-CM | POA: Diagnosis not present

## 2015-08-29 DIAGNOSIS — F1221 Cannabis dependence, in remission: Secondary | ICD-10-CM | POA: Diagnosis not present

## 2015-08-29 MED ORDER — OLANZAPINE 20 MG PO TABS: 20.0000 mg | ORAL_TABLET | Freq: Every day | ORAL | Status: DC

## 2015-08-29 MED ORDER — BENZTROPINE MESYLATE 1 MG PO TABS: ORAL_TABLET | ORAL | Status: DC

## 2015-08-29 MED ORDER — BUPROPION HCL ER (XL) 300 MG PO TB24: ORAL_TABLET | ORAL | Status: DC

## 2015-08-29 MED ORDER — DIVALPROEX SODIUM 500 MG PO DR TAB: 1000.0000 mg | DELAYED_RELEASE_TABLET | Freq: Every day | ORAL | Status: DC

## 2015-08-29 NOTE — Progress Notes (Signed)
[facility] [id number] Progress Note  Thomas Woodward [id number] 39 y.o.  08/29/2015 1:31 PM  Chief Complaint:  I am feeling better.  I'm sleeping better.            History of Present Illness: Thomas Woodward came for his followup appointment.  He is taking his medication as prescribed.  He denies any recent irritability, anger, agitation.  He is still have at times paranoia but denies any hallucination .  His sleep is good.  He is getting along with his brother.  He continues to go [employer] and he has no issues.  He denies any feeling of hopelessness or worthlessness.  He is seeing his therapist Thomas Woodward every month and reported everything is going well.  He has no tremors, shakes or any EPS.  Patient denies drinking or using any illegal substances.  His appetite is okay.  His vitals are stable.   Suicidal Ideation: No Plan Formed: No Patient has means to carry out plan: No  Homicidal Ideation: No Plan Formed: No Patient has means to carry out plan: No  ROS  Psychiatric: Agitation: No Hallucination: No Depressed Mood: No Insomnia: No Hypersomnia: No Altered Concentration: No Feels Worthless: No Grandiose Ideas: No Belief In Special Powers: No New/Increased Substance Abuse: No Compulsions: No  Neurologic: Headache: No Seizure: No Paresthesias: No  Past Medical Family, Social History:  Patient has hyperlipidemia, GERD.  He used to have high creatinine however since he is not taking lithium his creatinine is stable.  His primary care physician is Thomas Woodward at [facility].   Outpatient Encounter Prescriptions as of 08/29/2015  Medication Sig  . benztropine (COGENTIN) 1 MG tablet TAKE 1 TABLET BY MOUTH 2 TIMES DAILY  . buPROPion (WELLBUTRIN XL) 300 MG 24 hr tablet TAKE 1 TABLET BY MOUTH EVERY DAY  . divalproex (DEPAKOTE) 500 MG DR tablet Take 2 tablets (1,000 mg total) by mouth at bedtime.  Marland Kitchen OLANZapine (ZYPREXA) 20 MG tablet Take 1 tablet (20 mg  total) by mouth at bedtime.  Marland Kitchen omega-3 acid ethyl esters (LOVAZA) 1 G capsule Take 1 g by mouth 2 (two) times daily.  Marland Kitchen omeprazole (PRILOSEC) 20 MG capsule Take 20 mg by mouth daily.  Marland Kitchen terazosin (HYTRIN) 1 MG capsule Take 1 mg by mouth at bedtime.  . [DISCONTINUED] benztropine (COGENTIN) 1 MG tablet TAKE 1 TABLET BY MOUTH 2 TIMES DAILY  . [DISCONTINUED] buPROPion (WELLBUTRIN XL) 300 MG 24 hr tablet TAKE 1 TABLET BY MOUTH EVERY DAY  . [DISCONTINUED] divalproex (DEPAKOTE) 500 MG DR tablet Take 2 tablets (1,000 mg total) by mouth at bedtime.  . [DISCONTINUED] OLANZapine (ZYPREXA) 20 MG tablet Take 1 tablet (20 mg total) by mouth at bedtime.   No facility-administered encounter medications on file as of 08/29/2015.    Past Psychiatric History/Hospitalization(s): Patient has history of multiple psychiatric hospitalization.  He has several admission in 1994-1995 at [facility] and at [place].  He has history of taking overdose on Risperdal.  He has history of psychosis, mania and severe depression.  He has done intensive outpatient program.  [facility]  In the past he has taken Abilify, Seroquel, Strattera, lithium, Adderall, Celexa however he did very well on Zyprexa and Wellbutrin.  The patient has history of heavy drinking and using drugs. Anxiety: No Bipolar Disorder: Yes Depression: Yes Mania: Yes Psychosis: Yes Schizophrenia: No Personality Disorder: No Hospitalization for psychiatric illness: Yes History of Electroconvulsive  Shock Therapy: No Prior Suicide Attempts: Yes  Physical Exam: Constitutional:  BP 125/74 mmHg  Pulse 80  Ht 5' 11.75" (1.822 m)  Wt 213 lb 6.4 oz (96.798 kg)  BMI 29.16 kg/m2   No results found for this or any previous visit (from the past 2160 hour(s)).  General Appearance: alert, oriented, no acute distress and well nourished  Musculoskeletal: Strength & Muscle Tone: within normal  limits Gait & Station: normal Patient leans: N/A  Mental status examination. Patient is casually dressed and fairly groomed.  He appears shy but pleasant.  He maintained fair eye contact.  He described his mood neutral and his affect is flat.  His thought processes slow  but coherent.  He denies any active or passive suicidal or homicidal thoughts. He has paranoia but denies any delusions or any obsessive thoughts. His psychomotor activity is slow.  His fund of knowledge is average.  His attention and concentration is fair.  He has no tremors or any EPS. He is alert and oriented 3.  His insight judgment and impulse control is okay.  Established Problem, Stable/Improving (1), Review of Psycho-Social Stressors (1), Review of Last Therapy Session (1) and Review of Medication Regimen & Side Effects (2)  Assessment: Axis I: Bipolar disorder with psychotic features, alcohol abuse in remission, cannabis abuse in remission  Axis II: Deferred  Axis III: Hyperlipidemia, GERD, history of increased creatinine   Plan:  Patient is a stable on his current medication.  He has no side effects.  I will continue Depakote 1000 mg at bedtime, Zyprexa 20 mg at bedtime, Wellbutrin XL 300 mg at bedtime and Cogentin 1 mg at bedtime.  His last Depakote level was 7 months ago which was 33.3.  Discussed medication side effects and benefits.  Recommended to call us back if he has any question or any concern.  Encouraged to keep appointment with counseling for coping and social skills.  Follow-up in 3 months.  Thomas Woodward T., MD 08/29/2015

## 2015-09-21 DIAGNOSIS — Z23 Encounter for immunization: Secondary | ICD-10-CM | POA: Diagnosis not present

## 2015-10-11 ENCOUNTER — Other Ambulatory Visit (HOSPITAL_COMMUNITY): Payer: Self-pay | Admitting: Psychiatry

## 2015-10-12 ENCOUNTER — Other Ambulatory Visit (HOSPITAL_COMMUNITY): Payer: Self-pay | Admitting: Psychiatry

## 2015-10-13 ENCOUNTER — Other Ambulatory Visit (HOSPITAL_COMMUNITY): Payer: Self-pay | Admitting: Psychiatry

## 2015-10-13 DIAGNOSIS — E785 Hyperlipidemia, unspecified: Secondary | ICD-10-CM | POA: Diagnosis not present

## 2015-10-13 DIAGNOSIS — Z0001 Encounter for general adult medical examination with abnormal findings: Secondary | ICD-10-CM | POA: Diagnosis not present

## 2015-10-13 NOTE — Telephone Encounter (Signed)
Given 90 day supply on October 25.

## 2015-10-14 ENCOUNTER — Other Ambulatory Visit (HOSPITAL_COMMUNITY): Payer: Self-pay | Admitting: Psychiatry

## 2015-10-19 DIAGNOSIS — R7309 Other abnormal glucose: Secondary | ICD-10-CM | POA: Diagnosis not present

## 2015-10-19 DIAGNOSIS — F3289 Other specified depressive episodes: Secondary | ICD-10-CM | POA: Diagnosis not present

## 2015-10-19 DIAGNOSIS — K219 Gastro-esophageal reflux disease without esophagitis: Secondary | ICD-10-CM | POA: Diagnosis not present

## 2015-11-26 ENCOUNTER — Other Ambulatory Visit (HOSPITAL_COMMUNITY): Payer: Self-pay | Admitting: Psychiatry

## 2015-11-27 ENCOUNTER — Other Ambulatory Visit (HOSPITAL_COMMUNITY): Payer: Self-pay | Admitting: Psychiatry

## 2015-11-29 ENCOUNTER — Encounter (HOSPITAL_COMMUNITY): Payer: Self-pay | Admitting: Psychiatry

## 2015-11-29 ENCOUNTER — Ambulatory Visit (INDEPENDENT_AMBULATORY_CARE_PROVIDER_SITE_OTHER): Payer: 59 | Admitting: Psychiatry

## 2015-11-29 VITALS — BP 117/78 | HR 80 | Ht 71.75 in | Wt 211.0 lb

## 2015-11-29 DIAGNOSIS — F1221 Cannabis dependence, in remission: Secondary | ICD-10-CM | POA: Diagnosis not present

## 2015-11-29 DIAGNOSIS — F3131 Bipolar disorder, current episode depressed, mild: Secondary | ICD-10-CM | POA: Diagnosis not present

## 2015-11-29 DIAGNOSIS — F1021 Alcohol dependence, in remission: Secondary | ICD-10-CM

## 2015-11-29 DIAGNOSIS — F319 Bipolar disorder, unspecified: Secondary | ICD-10-CM

## 2015-11-29 MED ORDER — OLANZAPINE 20 MG PO TABS: 20.0000 mg | ORAL_TABLET | Freq: Every day | ORAL | Status: DC

## 2015-11-29 MED ORDER — BENZTROPINE MESYLATE 1 MG PO TABS: ORAL_TABLET | ORAL | Status: DC

## 2015-11-29 MED ORDER — BUPROPION HCL ER (XL) 300 MG PO TB24: ORAL_TABLET | ORAL | Status: DC

## 2015-11-29 MED ORDER — DIVALPROEX SODIUM 500 MG PO DR TAB: 1000.0000 mg | DELAYED_RELEASE_TABLET | Freq: Every day | ORAL | Status: DC

## 2015-11-29 NOTE — Progress Notes (Signed)
[facility] [address] Progress Note  Thomas Woodward [id number] 40 y.o.  11/29/2015 4:41 PM  Chief Complaint:  I am feeling better.  I'm sleeping better.            History of Present Illness: Thomas Woodward came for his followup appointment.  Patient is taking his medication as prescribed.  He had a good Christmas.  His sister came from [facility] and he had a good time.  He does not get along with his brother and has been not in contact with him.  Recently he seen primary care physician he had blood work.  His Depakote level is 36.  His hemoglobin A1c is 5.5.  His WBC count is 4.7 and creatinine 1. 2 .  His TSH and basic chemistry is normal.  He continues to see Thomas Woodward for counseling.  He is still has residual paranoia but denies any irritability, crying spells or any feeling of hopelessness or worthlessness.  He admitted some time not comfortable around people but he continues to work at a [employer] 3 days a week and 2 days that [employer].  Patient denies any tremors shakes or any EPS.  He like to continue his current medication.  Denies drinking or using any illegal substances.  His appetite is okay.  His vitals are stable.  Suicidal Ideation: No Plan Formed: No Patient has means to carry out plan: No  Homicidal Ideation: No Plan Formed: No Patient has means to carry out plan: No  ROS  Psychiatric: Agitation: No Hallucination: No Depressed Mood: No Insomnia: No Hypersomnia: No Altered Concentration: No Feels Worthless: No Grandiose Ideas: No Belief In Special Powers: No New/Increased Substance Abuse: No Compulsions: No  Neurologic: Headache: No Seizure: No Paresthesias: No  Past Medical Family, Social History:  Patient has hyperlipidemia, GERD.  He used to have high creatinine however since he is not taking lithium his creatinine is stable.  His primary care physician is Thomas Woodward at [facility].  He had blood work on October ninth 2016 which  shows WBC: 4.7, creatinine 1.2, hemoglobin A1c 5.5, valproic acid 36, platelet count normal, sodium potassium and AST ALT is also,.  His TSH is 1.86.  Outpatient Encounter Prescriptions as of 11/29/2015  Medication Sig  . benztropine (COGENTIN) 1 MG tablet TAKE 1 TABLET BY MOUTH 2 TIMES DAILY  . buPROPion (WELLBUTRIN XL) 300 MG 24 hr tablet TAKE 1 TABLET BY MOUTH EVERY DAY  . divalproex (DEPAKOTE) 500 MG DR tablet Take 2 tablets (1,000 mg total) by mouth at bedtime.  Marland Kitchen OLANZapine (ZYPREXA) 20 MG tablet Take 1 tablet (20 mg total) by mouth at bedtime.  Marland Kitchen omega-3 acid ethyl esters (LOVAZA) 1 G capsule Take 1 g by mouth 2 (two) times daily.  Marland Kitchen omeprazole (PRILOSEC) 20 MG capsule Take 20 mg by mouth daily.  Marland Kitchen terazosin (HYTRIN) 1 MG capsule Take 1 mg by mouth at bedtime.  . [DISCONTINUED] benztropine (COGENTIN) 1 MG tablet TAKE 1 TABLET BY MOUTH 2 TIMES DAILY  . [DISCONTINUED] buPROPion (WELLBUTRIN XL) 300 MG 24 hr tablet TAKE 1 TABLET BY MOUTH EVERY DAY  . [DISCONTINUED] divalproex (DEPAKOTE) 500 MG DR tablet Take 2 tablets (1,000 mg total) by mouth at bedtime.  . [DISCONTINUED] OLANZapine (ZYPREXA) 20 MG tablet Take 1 tablet (20 mg total) by mouth at bedtime.   No facility-administered encounter medications on file as of 11/29/2015.    Past Psychiatric History/Hospitalization(s): Patient has history of multiple psychiatric hospitalization.  He has several  admission in 1994-1995 at [facility] and at [facility].  He has history of taking overdose on Risperdal.  He has history of psychosis, mania and severe depression.  He has done intensive outpatient program.  [facility]  In the past he has taken Abilify, Seroquel, Strattera, lithium, Adderall, Celexa however he did very well on Zyprexa and Wellbutrin.  The patient has history of heavy drinking and using drugs. Anxiety: No Bipolar Disorder: Yes Depression: Yes Mania: Yes Psychosis:  Yes Schizophrenia: No Personality Disorder: No Hospitalization for psychiatric illness: Yes History of Electroconvulsive Shock Therapy: No Prior Suicide Attempts: Yes  Physical Exam: Constitutional:  BP 117/78 mmHg  Pulse 80  Ht 5' 11.75" (1.822 m)  Wt 211 lb (95.709 kg)  BMI 28.83 kg/m2   No results found for this or any previous visit (from the past 2160 hour(s)).  General Appearance: alert, oriented, no acute distress and well nourished  Musculoskeletal: Strength & Muscle Tone: within normal limits Gait & Station: normal Patient leans: N/A  Mental status examination. Patient is casually dressed and fairly groomed.  He appears shy but pleasant.  He maintained fair eye contact.  He described his mood neutral and his affect is flat.  His thought processes slow  but coherent.  He denies any active or passive suicidal or homicidal thoughts. He has paranoia but denies any delusions or any obsessive thoughts. His psychomotor activity is slow.  His fund of knowledge is average.  His attention and concentration is fair.  He has no tremors or any EPS. He is alert and oriented 3.  His insight judgment and impulse control is okay.  Established Problem, Stable/Improving (1), Review of Psycho-Social Stressors (1), Review or order clinical lab tests (1), Review of Last Therapy Session (1) and Review of Medication Regimen & Side Effects (2)  Assessment: Axis I: Bipolar disorder with psychotic features, alcohol abuse in remission, cannabis abuse in remission  Axis II: Deferred  Axis III: Hyperlipidemia, GERD, history of increased creatinine   Plan:  Patient is a stable on his current medication.  I review his blood work results.  He like to continue his current medication.  He has no agitation or anger.  I will continue Depakote 1000 mg at bedtime, Zyprexa 20 mg at bedtime, Wellbutrin XL 300 mg at bedtime and Cogentin 1 mg at bedtime. Recommended to call us back if he has any question or any  concern.  Encouraged to keep appointment with counseling for coping and social skills.  Follow-up in 3 months.  ARFEEN,SYED T., MD 11/29/2015

## 2016-01-06 ENCOUNTER — Other Ambulatory Visit (HOSPITAL_COMMUNITY): Payer: Self-pay | Admitting: Psychiatry

## 2016-02-28 ENCOUNTER — Ambulatory Visit (HOSPITAL_COMMUNITY): Payer: Self-pay | Admitting: Psychiatry

## 2016-02-29 ENCOUNTER — Other Ambulatory Visit (HOSPITAL_COMMUNITY): Payer: Self-pay | Admitting: Psychiatry

## 2016-02-29 DIAGNOSIS — F319 Bipolar disorder, unspecified: Secondary | ICD-10-CM

## 2016-02-29 DIAGNOSIS — F3131 Bipolar disorder, current episode depressed, mild: Secondary | ICD-10-CM

## 2016-03-05 MED ORDER — BUPROPION HCL ER (XL) 300 MG PO TB24: ORAL_TABLET | ORAL | Status: DC

## 2016-03-05 MED ORDER — OLANZAPINE 20 MG PO TABS: 20.0000 mg | ORAL_TABLET | Freq: Every day | ORAL | Status: DC

## 2016-03-05 MED ORDER — DIVALPROEX SODIUM 500 MG PO DR TAB: 1000.0000 mg | DELAYED_RELEASE_TABLET | Freq: Every day | ORAL | Status: DC

## 2016-03-05 NOTE — Telephone Encounter (Signed)
Met with Dr. Adele Schilder to inform of request received for a new Cogentin order.  Dr. Adele Schilder authorized new 90 day orders for all patient's current medications as patient was rescheduled from 02/28/16 to 03/13/16.  New 90 orders for patient's prescribed Wellbutrin XL, Depakote, Zyprexa and Cogentin e-scribed to [facility] [address] as approved by Dr. Adele Schilder.

## 2016-03-13 ENCOUNTER — Ambulatory Visit (INDEPENDENT_AMBULATORY_CARE_PROVIDER_SITE_OTHER): Payer: 59 | Admitting: Psychiatry

## 2016-03-13 ENCOUNTER — Encounter (HOSPITAL_COMMUNITY): Payer: Self-pay | Admitting: Psychiatry

## 2016-03-13 VITALS — BP 118/78 | HR 88 | Ht 71.0 in | Wt 222.4 lb

## 2016-03-13 DIAGNOSIS — F3131 Bipolar disorder, current episode depressed, mild: Secondary | ICD-10-CM | POA: Diagnosis not present

## 2016-03-13 DIAGNOSIS — F1221 Cannabis dependence, in remission: Secondary | ICD-10-CM | POA: Diagnosis not present

## 2016-03-13 DIAGNOSIS — F1021 Alcohol dependence, in remission: Secondary | ICD-10-CM | POA: Diagnosis not present

## 2016-03-13 NOTE — Progress Notes (Signed)
[facility] [address] Progress Note  Thomas Woodward [id number] 40 y.o.  03/13/2016 4:33 PM  Chief Complaint:  Medication management and follow-up.              History of Present Illness: Thomas Woodward came for his followup appointment.  He is taking his medication as prescribed.  He feels more stable in recent months and denies any side effects of medication.  He sleeping good.  He continues to enjoy working at [employer] but there are times when he missed the days because he woke up very late.  He is working at [employer] and his job is going very well.  He has no tremors or shakes.  He denies any paranoia or any hallucination.  Patient told he is learning how to control his symptoms by distracting himself when he is under a lot of stress.  He keeping a distance from his brother .  Patient denies any hallucination or any paranoia.  He denies any feeling of hopelessness or worthlessness.  His appetite is good.  His energy level is good.  Patient denies drinking alcohol or using any illegal substances.  He wants to continue his current psychiatric medication which are Zyprexa 20 mg at bedtime, Depakote 1000 mg at bedtime, Wellbutrin XL 300 mg daily and Cogentin 1 mg twice a day.  He has no tremors, shakes or any EPS.  Suicidal Ideation: No Plan Formed: No Patient has means to carry out plan: No  Homicidal Ideation: No Plan Formed: No Patient has means to carry out plan: No  ROS  Psychiatric: Agitation: No Hallucination: No Depressed Mood: No Insomnia: No Hypersomnia: No Altered Concentration: No Feels Worthless: No Grandiose Ideas: No Belief In Special Powers: No New/Increased Substance Abuse: No Compulsions: No  Neurologic: Headache: No Seizure: No Paresthesias: No  Past Medical Family, Social History:  Patient has hyperlipidemia, GERD.  He used to have high creatinine however since he is not taking lithium his creatinine is stable.  His primary care physician is Pearson Grippe at [facility].  He had blood work on October ninth 2016 which shows WBC: 4.7, creatinine 1.2, hemoglobin A1c 5.5, valproic acid 36, platelet count normal, sodium potassium and AST ALT is also,.  His TSH is 1.86.  Outpatient Encounter Prescriptions as of 03/13/2016  Medication Sig  . benztropine (COGENTIN) 1 MG tablet TAKE 1 TABLET BY MOUTH 2 TIMES DAILY  . buPROPion (WELLBUTRIN XL) 300 MG 24 hr tablet TAKE 1 TABLET BY MOUTH EVERY DAY  . divalproex (DEPAKOTE) 500 MG DR tablet Take 2 tablets (1,000 mg total) by mouth at bedtime.  Marland Kitchen OLANZapine (ZYPREXA) 20 MG tablet Take 1 tablet (20 mg total) by mouth at bedtime.  Marland Kitchen omega-3 acid ethyl esters (LOVAZA) 1 G capsule Take 1 g by mouth 2 (two) times daily.  Marland Kitchen omeprazole (PRILOSEC) 20 MG capsule Take 20 mg by mouth daily.  Marland Kitchen terazosin (HYTRIN) 1 MG capsule Take 1 mg by mouth at bedtime.   No facility-administered encounter medications on file as of 03/13/2016.    Past Psychiatric History/Hospitalization(s): Patient has history of multiple psychiatric hospitalization.  He has several admission in 1994-1995 at [facility] and at [facility].  He has history of taking overdose on Risperdal.  He has history of psychosis, mania and severe depression.  He has done intensive outpatient program.  [facility]  In the past he has taken Abilify, Seroquel, Strattera, lithium, Adderall, Celexa  however he did very well on Zyprexa and Wellbutrin.  The patient has history of heavy drinking and using drugs. Anxiety: No Bipolar Disorder: Yes Depression: Yes Mania: Yes Psychosis: Yes Schizophrenia: No Personality Disorder: No Hospitalization for psychiatric illness: Yes History of Electroconvulsive Shock Therapy: No Prior Suicide Attempts: Yes  Physical Exam: Constitutional:  BP 118/78 mmHg  Pulse 88  Ht 5\' 11"  (1.803 m)  Wt 222 lb 6.4 oz (100.88 kg)  BMI 31.03 kg/m2   No results  found for this or any previous visit (from the past 2160 hour(s)).  General Appearance: alert, oriented, no acute distress and well nourished  Musculoskeletal: Strength & Muscle Tone: within normal limits Gait & Station: normal Patient leans: N/A  Mental status examination. Patient is casually dressed and fairly groomed.  He appears shy but pleasant.  He maintained fair eye contact.  He described his mood neutral and his affect is flat.  His thought processes slow  but coherent.  He denies any active or passive suicidal or homicidal thoughts. He has paranoia but denies any delusions or any obsessive thoughts. His psychomotor activity is slow.  His fund of knowledge is average.  His attention and concentration is fair.  He has no tremors or any EPS. He is alert and oriented 3.  His insight judgment and impulse control is okay.  Established Problem, Stable/Improving (1), Review of Psycho-Social Stressors (1), Review of Last Therapy Session (1) and Review of Medication Regimen & Side Effects (2)  Assessment: Axis I: Bipolar disorder with psychotic features, alcohol abuse in remission, cannabis abuse in remission  Axis II: Deferred  Axis III: Hyperlipidemia, GERD, history of increased creatinine   Plan:  Patient is a stable on his current medication.  He has no side effects.  He like to continue his current medication.  He has no agitation or anger.  I will continue Depakote 1000 mg at bedtime, Zyprexa 20 mg at bedtime, Wellbutrin XL 300 mg at bedtime and Cogentin 1 mg at bedtime. Recommended to call us back if he has any question or any concern.  Encouraged to keep appointment with counseling for coping and social skills.  Follow-up in 3 months.  Sava Proby T., MD 03/13/2016

## 2016-04-15 DIAGNOSIS — K219 Gastro-esophageal reflux disease without esophagitis: Secondary | ICD-10-CM | POA: Diagnosis not present

## 2016-04-15 DIAGNOSIS — F3289 Other specified depressive episodes: Secondary | ICD-10-CM | POA: Diagnosis not present

## 2016-04-18 DIAGNOSIS — R739 Hyperglycemia, unspecified: Secondary | ICD-10-CM | POA: Diagnosis not present

## 2016-04-18 DIAGNOSIS — Z0000 Encounter for general adult medical examination without abnormal findings: Secondary | ICD-10-CM | POA: Diagnosis not present

## 2016-04-18 DIAGNOSIS — E785 Hyperlipidemia, unspecified: Secondary | ICD-10-CM | POA: Diagnosis not present

## 2016-06-13 ENCOUNTER — Other Ambulatory Visit (HOSPITAL_COMMUNITY): Payer: Self-pay | Admitting: Psychiatry

## 2016-06-13 ENCOUNTER — Ambulatory Visit (HOSPITAL_COMMUNITY): Payer: Self-pay | Admitting: Psychiatry

## 2016-06-13 DIAGNOSIS — F319 Bipolar disorder, unspecified: Secondary | ICD-10-CM

## 2016-06-14 ENCOUNTER — Other Ambulatory Visit (HOSPITAL_COMMUNITY): Payer: Self-pay | Admitting: Psychiatry

## 2016-06-14 DIAGNOSIS — F3131 Bipolar disorder, current episode depressed, mild: Secondary | ICD-10-CM

## 2016-07-15 ENCOUNTER — Other Ambulatory Visit (HOSPITAL_COMMUNITY): Payer: Self-pay | Admitting: Psychiatry

## 2016-07-15 DIAGNOSIS — F319 Bipolar disorder, unspecified: Secondary | ICD-10-CM

## 2016-07-15 DIAGNOSIS — F3131 Bipolar disorder, current episode depressed, mild: Secondary | ICD-10-CM

## 2016-07-16 MED ORDER — BUPROPION HCL ER (XL) 300 MG PO TB24: ORAL_TABLET | ORAL | 0 refills | Status: DC

## 2016-07-16 NOTE — Telephone Encounter (Signed)
Met with Dr. Lovena Le who approved a 90 day refill of patient's prescribed Zyprexa and Wellbutrin XL as patient's appointment from 06/13/16 was rescheduled to 07/25/16 due to Dr. Adele Schilder being out.  New 90 day orders for patient's Zyprexa and Wellbutrin XL were [facility] [address] as authorized by Dr. Lovena Le.

## 2016-07-25 ENCOUNTER — Ambulatory Visit (HOSPITAL_COMMUNITY): Payer: Self-pay | Admitting: Psychiatry

## 2016-08-15 DIAGNOSIS — Z23 Encounter for immunization: Secondary | ICD-10-CM | POA: Diagnosis not present

## 2016-09-15 ENCOUNTER — Other Ambulatory Visit (HOSPITAL_COMMUNITY): Payer: Self-pay | Admitting: Psychiatry

## 2016-09-15 DIAGNOSIS — F319 Bipolar disorder, unspecified: Secondary | ICD-10-CM

## 2016-09-15 DIAGNOSIS — F3131 Bipolar disorder, current episode depressed, mild: Secondary | ICD-10-CM

## 2016-10-08 ENCOUNTER — Encounter (HOSPITAL_COMMUNITY): Payer: Self-pay | Admitting: Psychiatry

## 2016-10-08 ENCOUNTER — Ambulatory Visit (INDEPENDENT_AMBULATORY_CARE_PROVIDER_SITE_OTHER): Payer: 59 | Admitting: Psychiatry

## 2016-10-08 VITALS — BP 140/98 | HR 90 | Ht 71.0 in | Wt 231.4 lb

## 2016-10-08 DIAGNOSIS — F319 Bipolar disorder, unspecified: Secondary | ICD-10-CM

## 2016-10-08 DIAGNOSIS — Z87891 Personal history of nicotine dependence: Secondary | ICD-10-CM

## 2016-10-08 DIAGNOSIS — Z818 Family history of other mental and behavioral disorders: Secondary | ICD-10-CM | POA: Diagnosis not present

## 2016-10-08 DIAGNOSIS — F3131 Bipolar disorder, current episode depressed, mild: Secondary | ICD-10-CM | POA: Diagnosis not present

## 2016-10-08 DIAGNOSIS — Z79899 Other long term (current) drug therapy: Secondary | ICD-10-CM

## 2016-10-08 DIAGNOSIS — Z811 Family history of alcohol abuse and dependence: Secondary | ICD-10-CM

## 2016-10-08 MED ORDER — BENZTROPINE MESYLATE 1 MG PO TABS: 1.0000 mg | ORAL_TABLET | Freq: Two times a day (BID) | ORAL | 0 refills | Status: DC

## 2016-10-08 MED ORDER — OLANZAPINE 15 MG PO TABS: 15.0000 mg | ORAL_TABLET | Freq: Every day | ORAL | 0 refills | Status: DC

## 2016-10-08 MED ORDER — BUPROPION HCL ER (XL) 300 MG PO TB24: ORAL_TABLET | ORAL | 0 refills | Status: DC

## 2016-10-08 MED ORDER — DIVALPROEX SODIUM 500 MG PO DR TAB: 1000.0000 mg | DELAYED_RELEASE_TABLET | Freq: Every day | ORAL | 0 refills | Status: DC

## 2016-10-08 NOTE — Progress Notes (Signed)
BH MD/PA/NP OP Progress Note  10/08/2016 2:21 PM Thomas Woodward  MRN:  [id number]  Chief Complaint:  Chief Complaint    Follow-up     Subjective:  I'm doing good on my medication.  HPI: Thomas Woodward came for his follow-up appointment.  He is compliant with medication and reported no side effects.  [facility] not [name].  Patient mentioned he does not know what to do because he get bored get easily.  He continues to work at [employer] and he likes his job there very well.  He sleeping good.  He still have sometimes paranoia and irritability but he tried to keep himself busy.  So far he has no issues with his brother.  He endorse tremors on his right hand which could be due to Zyprexa.  He sleeping good.  He denies any agitation, feeling of hopelessness or worthlessness.  He denies any mania or psychosis.  He is scheduled to see his primary care physician in first week of January.  Patient denies drinking alcohol or using any illegal substances.  He lives with his mother and his brother.  He is taking Wellbutrin, Cogentin, Depakote and Zyprexa.  His last lithium level was 33 which was done in March 2016.  Patient denies drinking alcohol or using any illegal substances.  Visit Diagnosis:    ICD-9-CM ICD-10-CM   1. Bipolar 1 disorder, depressed, mild (HCC) 296.51 F31.31 buPROPion (WELLBUTRIN XL) 300 MG 24 hr tablet     benztropine (COGENTIN) 1 MG tablet     TSH     Valproic acid level     COMPLETE METABOLIC PANEL WITH GFR     CBC with Differential/Platelet     Hemoglobin A1c  2. Bipolar 1 disorder, depressed (HCC) 296.50 F31.9 divalproex (DEPAKOTE) 500 MG DR tablet     OLANZapine (ZYPREXA) 15 MG tablet  3. Encounter for long-term (current) use of medications V58.69 Z79.899 TSH     Valproic acid level     COMPLETE METABOLIC PANEL WITH GFR     CBC with Differential/Platelet     Hemoglobin A1c    Past Psychiatric History: Patient has history of multiple psychiatric  hospitalization.  He has several admission from [phone] [name]  He has history of taking overdose on Risperdal.  He has a history of psychosis, mania, severe depression.  He has done multiple intensive outpatient program.  His last psychiatric hospitalization was in 2006.  In the past she had tried Abilify, Seroquel, Strattera, lithium, Adderall, Celexa.  Patient has history of drinking and using drugs in the past.  Past Medical History:  Patient has hyperlipidemia, GERD.  He has history of high creatinine with lithium. Past Medical History:  Diagnosis Date  . GERD (gastroesophageal reflux disease)   . Hyperlipidemia     Past Surgical History:  Procedure Laterality Date  . KNEE SURGERY      Family Psychiatric History: See below  Family History:  Family History  Problem Relation Age of Onset  . Depression Mother   . Alcohol abuse Brother   . Bipolar disorder Brother     Social History:  Social History   Social History  . Marital status: Single    Spouse name: N/A  . Number of children: N/A  . Years of education: N/A   Social History Main Topics  . Smoking status: Former Smoker    Packs/day: 0.50    Years: 25.00  Quit date: 04/26/2013  . Smokeless tobacco: None  . Alcohol use No  . Drug use: No  . Sexual activity: Not Currently   Other Topics Concern  . None   Social History Narrative  . None    Allergies: No Known Allergies  Metabolic Disorder Labs: Lab Results  Component Value Date   HGBA1C 5.8 (H) 01/12/2015   MPG 120 (H) 01/12/2015   No results found for: PROLACTIN No results found for: CHOL, TRIG, HDL, CHOLHDL, VLDL, LDLCALC   Current Medications: Current Outpatient Prescriptions  Medication Sig Dispense Refill  . benztropine (COGENTIN) 1 MG tablet Take 1 tablet (1 mg total) by mouth 2 (two) times daily. 180 tablet 0  . buPROPion (WELLBUTRIN XL) 300 MG 24 hr tablet TAKE 1 TABLET BY MOUTH EVERY DAY 90 tablet 0   . divalproex (DEPAKOTE) 500 MG DR tablet Take 2 tablets (1,000 mg total) by mouth at bedtime. 180 tablet 0  . OLANZapine (ZYPREXA) 15 MG tablet Take 1 tablet (15 mg total) by mouth at bedtime. 90 tablet 0  . omega-3 acid ethyl esters (LOVAZA) 1 G capsule Take 1 g by mouth 2 (two) times daily.    Marland Kitchen. omeprazole (PRILOSEC) 20 MG capsule Take 20 mg by mouth daily.    Marland Kitchen. terazosin (HYTRIN) 1 MG capsule Take 1 mg by mouth at bedtime.     No current facility-administered medications for this visit.     Neurologic: Headache: No Seizure: No Paresthesias: No  Musculoskeletal: Strength & Muscle Tone: within normal limits Gait & Station: normal Patient leans: N/A  Psychiatric Specialty Exam: Review of Systems  Constitutional: Negative.   Neurological: Positive for tremors.    Blood pressure (!) 140/98, pulse 90, height 5\' 11"  (1.803 m), weight 231 lb 6.4 oz (105 kg).Body mass index is 32.27 kg/m.  General Appearance: Casual  Eye Contact:  Fair  Speech:  Slow  Volume:  Normal  Mood:  Anxious  Affect:  Flat  Thought Process:  Goal Directed  Orientation:  Full (Time, Place, and Person)  Thought Content: WDL and Logical   Suicidal Thoughts:  No  Homicidal Thoughts:  No  Memory:  Immediate;   Good Recent;   Fair Remote;   Fair  Judgement:  Good  Insight:  Good  Psychomotor Activity:  Decreased  Concentration:  Concentration: Good and Attention Span: Good  Recall:  Good  Fund of Knowledge: Good  Language: Good  Akathisia:  No  Handed:  Right  AIMS (if indicated):  None reported   Assets:  Communication Skills Desire for Improvement Financial Resources/Insurance Housing Physical Health Transportation  ADL's:  Intact  Cognition: WNL  Sleep:  Good      Treatment Plan Summary:Assessment; bipolar disorder with psychotic features in remission.  Alcohol abuse in remission.  Cannabis abuse in remission.   Plan; Patient is stable on his current psychiatric medication.  He has  mild tremors on his hand.  I will reduce Zyprexa 15 mg to help his tremors.  Continue Depakote 1000 mg at bedtime, Wellbutrin XL 300 mg in the morning and Cogentin 1 mg at bedtime.  I would also do blood work including CBC, CMP, hemoglobin A1c, Depakote level and TSH.  Discussed medication side effects and benefits.  Recommended to call us back if he has any question, concern if he feel worsening of the symptom.  Follow-up in 3 months.  ARFEEN,SYED T., MD 10/08/2016, 2:21 PM

## 2016-11-01 DIAGNOSIS — R739 Hyperglycemia, unspecified: Secondary | ICD-10-CM | POA: Diagnosis not present

## 2016-11-01 DIAGNOSIS — E785 Hyperlipidemia, unspecified: Secondary | ICD-10-CM | POA: Diagnosis not present

## 2016-11-08 DIAGNOSIS — Z0000 Encounter for general adult medical examination without abnormal findings: Secondary | ICD-10-CM | POA: Diagnosis not present

## 2016-11-08 DIAGNOSIS — R739 Hyperglycemia, unspecified: Secondary | ICD-10-CM | POA: Diagnosis not present

## 2016-11-08 DIAGNOSIS — E785 Hyperlipidemia, unspecified: Secondary | ICD-10-CM | POA: Diagnosis not present

## 2016-12-31 DIAGNOSIS — Z79899 Other long term (current) drug therapy: Secondary | ICD-10-CM | POA: Diagnosis not present

## 2016-12-31 LAB — COMPLETE METABOLIC PANEL WITH GFR
ALBUMIN: 4.1 g/dL (ref 3.6–5.1)
ALK PHOS: 46 U/L (ref 40–115)
ALT: 17 U/L (ref 9–46)
AST: 14 U/L (ref 10–40)
BUN: 15 mg/dL (ref 7–25)
CALCIUM: 9.4 mg/dL (ref 8.6–10.3)
CHLORIDE: 105 mmol/L (ref 98–110)
CO2: 26 mmol/L (ref 20–31)
Creat: 1.42 mg/dL — ABNORMAL HIGH (ref 0.60–1.35)
GFR, EST AFRICAN AMERICAN: 70 mL/min (ref >=60)
GFR, EST NON AFRICAN AMERICAN: 61 mL/min (ref >=60)
Glucose, Bld: 94 mg/dL (ref 65–99)
POTASSIUM: 4.5 mmol/L (ref 3.5–5.3)
Sodium: 140 mmol/L (ref 135–146)
Total Bilirubin: 0.5 mg/dL (ref 0.2–1.2)
Total Protein: 7 g/dL (ref 6.1–8.1)

## 2016-12-31 LAB — CBC WITH DIFFERENTIAL/PLATELET
BASOS PCT: 1 %
Basophils Absolute: 46 cells/uL (ref 0–200)
EOS PCT: 2 %
Eosinophils Absolute: 92 cells/uL (ref 15–500)
HCT: 42.2 % (ref 38.5–50.0)
Hemoglobin: 14 g/dL (ref 13.2–17.1)
LYMPHS PCT: 32 %
Lymphs Abs: 1472 cells/uL (ref 850–3900)
MCH: 29.9 pg (ref 27.0–33.0)
MCHC: 33.2 g/dL (ref 32.0–36.0)
MCV: 90.2 fL (ref 80.0–100.0)
MONO ABS: 230 {cells}/uL (ref 200–950)
MONOS PCT: 5 %
MPV: 9.6 fL (ref 7.5–12.5)
Neutro Abs: 2760 cells/uL (ref 1500–7800)
Neutrophils Relative %: 60 %
PLATELETS: 232 10*3/uL (ref 140–400)
RBC: 4.68 MIL/uL (ref 4.20–5.80)
RDW: 13.9 % (ref 11.0–15.0)
WBC: 4.6 10*3/uL (ref 3.8–10.8)

## 2017-01-01 LAB — HEMOGLOBIN A1C
HEMOGLOBIN A1C: 5.2 % (ref <5.7)
MEAN PLASMA GLUCOSE: 103 mg/dL

## 2017-01-01 LAB — TSH: TSH: 0.68 m[IU]/L (ref 0.40–4.50)

## 2017-01-01 LAB — VALPROIC ACID LEVEL: Valproic Acid Lvl: 44 ug/mL — ABNORMAL LOW (ref 50.0–100.0)

## 2017-01-03 ENCOUNTER — Other Ambulatory Visit (HOSPITAL_COMMUNITY): Payer: Self-pay | Admitting: Psychiatry

## 2017-01-03 DIAGNOSIS — F319 Bipolar disorder, unspecified: Secondary | ICD-10-CM

## 2017-01-07 ENCOUNTER — Ambulatory Visit (INDEPENDENT_AMBULATORY_CARE_PROVIDER_SITE_OTHER): Payer: 59 | Admitting: Psychiatry

## 2017-01-07 ENCOUNTER — Encounter (HOSPITAL_COMMUNITY): Payer: Self-pay | Admitting: Psychiatry

## 2017-01-07 ENCOUNTER — Other Ambulatory Visit (HOSPITAL_COMMUNITY): Payer: Self-pay | Admitting: Psychiatry

## 2017-01-07 DIAGNOSIS — F3131 Bipolar disorder, current episode depressed, mild: Secondary | ICD-10-CM | POA: Diagnosis not present

## 2017-01-07 DIAGNOSIS — Z87891 Personal history of nicotine dependence: Secondary | ICD-10-CM

## 2017-01-07 DIAGNOSIS — Z818 Family history of other mental and behavioral disorders: Secondary | ICD-10-CM | POA: Diagnosis not present

## 2017-01-07 DIAGNOSIS — Z79899 Other long term (current) drug therapy: Secondary | ICD-10-CM | POA: Diagnosis not present

## 2017-01-07 DIAGNOSIS — Z811 Family history of alcohol abuse and dependence: Secondary | ICD-10-CM

## 2017-01-07 DIAGNOSIS — F1021 Alcohol dependence, in remission: Secondary | ICD-10-CM

## 2017-01-07 DIAGNOSIS — F1221 Cannabis dependence, in remission: Secondary | ICD-10-CM

## 2017-01-07 DIAGNOSIS — F319 Bipolar disorder, unspecified: Secondary | ICD-10-CM

## 2017-01-07 MED ORDER — OLANZAPINE 10 MG PO TABS: 10.0000 mg | ORAL_TABLET | Freq: Every day | ORAL | 0 refills | Status: DC

## 2017-01-07 MED ORDER — BENZTROPINE MESYLATE 1 MG PO TABS: 1.0000 mg | ORAL_TABLET | Freq: Two times a day (BID) | ORAL | 0 refills | Status: DC

## 2017-01-07 MED ORDER — DIVALPROEX SODIUM 500 MG PO DR TAB: 1000.0000 mg | DELAYED_RELEASE_TABLET | Freq: Every day | ORAL | 0 refills | Status: DC

## 2017-01-07 MED ORDER — BUPROPION HCL ER (XL) 300 MG PO TB24: ORAL_TABLET | ORAL | 0 refills | Status: DC

## 2017-01-07 NOTE — Progress Notes (Signed)
[facility] MD/PA/NP OP Progress Note  01/07/2017 1:42 PM Thomas Woodward  MRN:  [id number]  Chief Complaint:  Chief Complaint    Follow-up; Anxiety     Subjective:  I'm doing good but I still have tremors.  HPI: Thomas Woodward came for his follow-up on.  On his last visit we reduce his Zyprexa to help the tremors.  He still have mild tremors in his right hand.  He is taking Cogentin.  He denies any irritability or any paranoia.  He mentioned his Christmas was very quite.  He continues to work at [employer] and he likes his job there.  He sleeping good.  He denies any hallucination at sometime gets paranoid.  He denies any issues with his brother.  He is very close to his nephew.  He had blood work and his Depakote level is 44.  Patient denies drinking alcohol or using any illegal substances.  He is compliant with Wellbutrin, Cogentin, Depakote and Zyprexa.  His appetite is okay.  He lost more than 10 pounds in the past few weeks since Zyprexa is reduced.  He is more active and social.   Visit Diagnosis:    ICD-9-CM ICD-10-CM   1. Bipolar 1 disorder, depressed ([facility]) 296.50 F31.9 OLANZapine (ZYPREXA) 10 MG tablet     divalproex (DEPAKOTE) 500 MG DR tablet  2. Bipolar 1 disorder, depressed, mild (HCC) 296.51 F31.31 benztropine (COGENTIN) 1 MG tablet     buPROPion (WELLBUTRIN XL) 300 MG 24 hr tablet    Past Psychiatric History: Reviewed. Patient has history of multiple psychiatric hospitalization.  He has several admission from [phone] at [facility] and [facility].  He has history of taking overdose on Risperdal.  He has a history of psychosis, mania, severe depression.  He has done multiple intensive outpatient program.  His last psychiatric hospitalization was in 2006.  In the past she had tried Abilify, Seroquel, Strattera, lithium, Adderall, Celexa.  Patient has history of drinking and using drugs in the past.  Past Medical History:  Past Medical History:  Diagnosis Date  . GERD  (gastroesophageal reflux disease)   . Hyperlipidemia     Past Surgical History:  Procedure Laterality Date  . KNEE SURGERY      Family Psychiatric History: Reviewed.  Family History:  Family History  Problem Relation Age of Onset  . Depression Mother   . Alcohol abuse Brother   . Bipolar disorder Brother     Social History:  Social History   Social History  . Marital status: Single    Spouse name: N/A  . Number of children: N/A  . Years of education: N/A   Social History Main Topics  . Smoking status: Former Smoker    Packs/day: 0.50    Years: 25.00    Quit date: 04/26/2013  . Smokeless tobacco: Not on file  . Alcohol use No  . Drug use: No  . Sexual activity: Not Currently   Other Topics Concern  . Not on file   Social History Narrative  . No narrative on file    Allergies: No Known Allergies  Metabolic Disorder Labs: Recent Results (from the past 2160 hour(s))  TSH     Status: None   Collection Time: 12/31/16  4:31 PM  Result Value Ref Range   TSH 0.68 0.40 - 4.50 mIU/L  Valproic acid level     Status: Abnormal   Collection Time: 12/31/16  4:31 PM  Result Value Ref Range   Valproic Acid Lvl  44.0 (L) 50.0 - 100.0 ug/mL  COMPLETE METABOLIC PANEL WITH GFR     Status: Abnormal   Collection Time: 12/31/16  4:31 PM  Result Value Ref Range   Sodium 140 135 - 146 mmol/L   Potassium 4.5 3.5 - 5.3 mmol/L   Chloride 105 98 - 110 mmol/L   CO2 26 20 - 31 mmol/L   Glucose, Bld 94 65 - 99 mg/dL   BUN 15 7 - 25 mg/dL   Creat 1.42 (H) 0.60 - 1.35 mg/dL   Total Bilirubin 0.5 0.2 - 1.2 mg/dL   Alkaline Phosphatase 46 40 - 115 U/L   AST 14 10 - 40 U/L   ALT 17 9 - 46 U/L   Total Protein 7.0 6.1 - 8.1 g/dL   Albumin 4.1 3.6 - 5.1 g/dL   Calcium 9.4 8.6 - 10.3 mg/dL   GFR, Est African American 70 >=60 mL/min   GFR, Est Non African American 61 >=60 mL/min  CBC with Differential/Platelet     Status: None   Collection Time: 12/31/16  4:31 PM  Result Value Ref  Range   WBC 4.6 3.8 - 10.8 K/uL   RBC 4.68 4.20 - 5.80 MIL/uL   Hemoglobin 14.0 13.2 - 17.1 g/dL   HCT 42.2 38.5 - 50.0 %   MCV 90.2 80.0 - 100.0 fL   MCH 29.9 27.0 - 33.0 pg   MCHC 33.2 32.0 - 36.0 g/dL   RDW 13.9 11.0 - 15.0 %   Platelets 232 140 - 400 K/uL   MPV 9.6 7.5 - 12.5 fL   Neutro Abs 2,760 1,500 - 7,800 cells/uL   Lymphs Abs 1,472 850 - 3,900 cells/uL   Monocytes Absolute 230 200 - 950 cells/uL   Eosinophils Absolute 92 15 - 500 cells/uL   Basophils Absolute 46 0 - 200 cells/uL   Neutrophils Relative % 60 %   Lymphocytes Relative 32 %   Monocytes Relative 5 %   Eosinophils Relative 2 %   Basophils Relative 1 %   Smear Review Criteria for review not met   Hemoglobin A1c     Status: None   Collection Time: 12/31/16  4:31 PM  Result Value Ref Range   Hgb A1c MFr Bld 5.2 <5.7 %    Comment:   For the purpose of screening for the presence of diabetes:   <5.7%       Consistent with the absence of diabetes 5.7-6.4 %   Consistent with increased risk for diabetes (prediabetes) >=6.5 %     Consistent with diabetes   This assay result is consistent with a decreased risk of diabetes.   Currently, no consensus exists regarding use of hemoglobin A1c for diagnosis of diabetes in children.   According to American Diabetes Association (ADA) guidelines, hemoglobin A1c <7.0% represents optimal control in non-pregnant diabetic patients. Different metrics may apply to specific patient populations. Standards of Medical Care in Diabetes (ADA).      Mean Plasma Glucose 103 mg/dL   Lab Results  Component Value Date   HGBA1C 5.2 12/31/2016   MPG 103 12/31/2016   MPG 120 (H) 01/12/2015   No results found for: PROLACTIN No results found for: CHOL, TRIG, HDL, CHOLHDL, VLDL, LDLCALC   Current Medications: Current Outpatient Prescriptions  Medication Sig Dispense Refill  . benztropine (COGENTIN) 1 MG tablet Take 1 tablet (1 mg total) by mouth 2 (two) times daily. 180 tablet 0   . buPROPion (WELLBUTRIN XL) 300 MG 24 hr tablet TAKE  1 TABLET BY MOUTH EVERY DAY 90 tablet 0  . divalproex (DEPAKOTE) 500 MG DR tablet Take 2 tablets (1,000 mg total) by mouth at bedtime. 180 tablet 0  . OLANZapine (ZYPREXA) 10 MG tablet Take 1 tablet (10 mg total) by mouth at bedtime. Dose decrease 90 tablet 0  . omega-3 acid ethyl esters (LOVAZA) 1 G capsule Take 1 g by mouth 2 (two) times daily.    Marland Kitchen omeprazole (PRILOSEC) 20 MG capsule Take 20 mg by mouth daily.    Marland Kitchen terazosin (HYTRIN) 1 MG capsule Take 1 mg by mouth at bedtime.     No current facility-administered medications for this visit.     Neurologic: Headache: No Seizure: No Paresthesias: No  Musculoskeletal: Strength & Muscle Tone: within normal limits Gait & Station: normal Patient leans: N/A  Psychiatric Specialty Exam: Review of Systems  Constitutional: Positive for weight loss.  HENT: Negative.   Musculoskeletal: Negative.   Skin: Negative for itching and rash.  Neurological: Positive for tremors.    Blood pressure 118/70, pulse 69, height '5\' 11"'  (1.803 m), weight 219 lb 12.8 oz (99.7 kg).Body mass index is 30.66 kg/m.  General Appearance: Casual and Shy  Eye Contact:  Fair  Speech:  Slow  Volume:  Normal  Mood:  Euthymic  Affect:  Congruent  Thought Process:  Goal Directed  Orientation:  Full (Time, Place, and Person)  Thought Content: Logical   Suicidal Thoughts:  No  Homicidal Thoughts:  No  Memory:  Immediate;   Good  Judgement:  Fair  Insight:  Fair  Psychomotor Activity:  Tremor  Concentration:  Concentration: Fair and Attention Span: Fair  Recall:  Good  Fund of Knowledge: Good  Language: Good  Akathisia:  Mild tremors in his left hand  Handed:  Right  AIMS (if indicated):  0  Assets:  Communication Skills Desire for Improvement Housing Physical Health Resilience Social Support  ADL's:  Intact  Cognition: WNL  Sleep:  Good     Assessment: Bipolar disorder with psychotic  features.  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  Plan: Patient is still has mild tremors on his left hand.  I will further reduce Zyprexa 10 mg.  Review his blood work.  His Depakote level is 44.  His hemoglobin A1c is 5.2, creatinine 1.42 .  His CBC and other chemistries normal.  Discussed medication side effects and benefits.  Recommended to call us back if he feels worsening of the symptom.  Follow-up in 3 months.  Teona Vargus T., MD 01/07/2017, 1:42 PM

## 2017-03-17 ENCOUNTER — Other Ambulatory Visit (HOSPITAL_COMMUNITY): Payer: Self-pay | Admitting: Psychiatry

## 2017-03-17 DIAGNOSIS — F319 Bipolar disorder, unspecified: Secondary | ICD-10-CM

## 2017-04-08 ENCOUNTER — Ambulatory Visit (INDEPENDENT_AMBULATORY_CARE_PROVIDER_SITE_OTHER): Payer: 59 | Admitting: Psychiatry

## 2017-04-08 ENCOUNTER — Encounter (HOSPITAL_COMMUNITY): Payer: Self-pay | Admitting: Psychiatry

## 2017-04-08 DIAGNOSIS — Z818 Family history of other mental and behavioral disorders: Secondary | ICD-10-CM | POA: Diagnosis not present

## 2017-04-08 DIAGNOSIS — F3131 Bipolar disorder, current episode depressed, mild: Secondary | ICD-10-CM | POA: Diagnosis not present

## 2017-04-08 DIAGNOSIS — Z87891 Personal history of nicotine dependence: Secondary | ICD-10-CM | POA: Diagnosis not present

## 2017-04-08 DIAGNOSIS — Z811 Family history of alcohol abuse and dependence: Secondary | ICD-10-CM

## 2017-04-08 DIAGNOSIS — F319 Bipolar disorder, unspecified: Secondary | ICD-10-CM

## 2017-04-08 MED ORDER — BENZTROPINE MESYLATE 1 MG PO TABS: 1.0000 mg | ORAL_TABLET | Freq: Two times a day (BID) | ORAL | 0 refills | Status: DC

## 2017-04-08 MED ORDER — DIVALPROEX SODIUM 500 MG PO DR TAB: 1000.0000 mg | DELAYED_RELEASE_TABLET | Freq: Every day | ORAL | 0 refills | Status: DC

## 2017-04-08 MED ORDER — OLANZAPINE 10 MG PO TABS: 10.0000 mg | ORAL_TABLET | Freq: Every day | ORAL | 0 refills | Status: DC

## 2017-04-08 MED ORDER — BUPROPION HCL ER (XL) 300 MG PO TB24: ORAL_TABLET | ORAL | 0 refills | Status: DC

## 2017-04-08 NOTE — Progress Notes (Signed)
[facility] MD/PA/NP OP Progress Note  04/08/2017 1:47 PM Thomas Woodward  MRN:  [id number]  Chief Complaint:  Subjective:  I'm doing fine.  I still have tremors in my hand but they are not as bad.  HPI: Thomas Woodward came for his follow-up appointment.  On his last visit we further reduce his Zyprexa to 10 mg to help his tremors.  Patient seen mild improvement in his tremor but he still have tremors in his hand.  He sleeping okay.  He denies any paranoia or any hallucination.  He continues to work at [employer] and recently his supervisor quit but he is getting along with his new supervisor.  He denies any irritability, anger, mania or any psychosis.  Sometime he gets irritable with his brother who also see psychiatrist in this office.  Patient denies any suicidal thoughts or homicidal thought.  He like to continue his current psychiatric medication.  He is taking Wellbutrin, Cogentin, Depakote and Zyprexa.  His last Depakote level was 44.  His appetite is okay.  He is more active social increased energy level.  Patient denies drinking alcohol or using any illegal substances.  Visit Diagnosis:    ICD-9-CM ICD-10-CM   1. Bipolar 1 disorder, depressed (HCC) 296.50 F31.9 OLANZapine (ZYPREXA) 10 MG tablet     divalproex (DEPAKOTE) 500 MG DR tablet  2. Bipolar 1 disorder, depressed, mild (HCC) 296.51 F31.31 buPROPion (WELLBUTRIN XL) 300 MG 24 hr tablet     benztropine (COGENTIN) 1 MG tablet    Past Psychiatric History: Reviewed. Patient has history of multiple psychiatric hospitalization. He has several admission from 1994-1995 at [name]. He has history of taking overdose on Risperdal. He has a history of psychosis, mania, severe depression. He has done multiple intensive outpatient program. His last psychiatric hospitalization was in 2006. In the past she had tried Abilify, Seroquel, Strattera, lithium, Adderall, Celexa. He was taking Zyprexa 20 mg but does reduce to do tremors  and shakes.  Patient has history of drinking and using drugs in the past.  Past Medical History:  Past Medical History:  Diagnosis Date  . GERD (gastroesophageal reflux disease)   . Hyperlipidemia     Past Surgical History:  Procedure Laterality Date  . KNEE SURGERY      Family Psychiatric History: Reviewed.  Family History:  Family History  Problem Relation Age of Onset  . Depression Mother   . Alcohol abuse Brother   . Bipolar disorder Brother     Social History:  Social History   Social History  . Marital status: Single    Spouse name: N/A  . Number of children: N/A  . Years of education: N/A   Social History Main Topics  . Smoking status: Former Smoker    Packs/day: 0.50    Years: 25.00    Quit date: 04/26/2013  . Smokeless tobacco: Never Used  . Alcohol use No  . Drug use: No  . Sexual activity: Not Currently   Other Topics Concern  . Not on file   Social History Narrative  . No narrative on file    Allergies: No Known Allergies  Metabolic Disorder Labs: Lab Results  Component Value Date   HGBA1C 5.2 12/31/2016   MPG 103 12/31/2016   MPG 120 (H) 01/12/2015   No results found for: PROLACTIN No results found for: CHOL, TRIG, HDL, CHOLHDL, VLDL, LDLCALC   Current Medications: Current Outpatient Prescriptions  Medication Sig Dispense Refill  . benztropine (COGENTIN) 1  MG tablet Take 1 tablet (1 mg total) by mouth 2 (two) times daily. 180 tablet 0  . buPROPion (WELLBUTRIN XL) 300 MG 24 hr tablet TAKE 1 TABLET BY MOUTH EVERY DAY 90 tablet 0  . divalproex (DEPAKOTE) 500 MG DR tablet Take 2 tablets (1,000 mg total) by mouth at bedtime. 180 tablet 0  . OLANZapine (ZYPREXA) 10 MG tablet Take 1 tablet (10 mg total) by mouth at bedtime. Dose decrease 90 tablet 0  . omega-3 acid ethyl esters (LOVAZA) 1 G capsule Take 1 g by mouth 2 (two) times daily.    Marland Kitchen. omeprazole (PRILOSEC) 20 MG capsule Take 20 mg by mouth daily.    Marland Kitchen. terazosin (HYTRIN) 1 MG capsule  Take 1 mg by mouth at bedtime.     No current facility-administered medications for this visit.     Neurologic: Headache: No Seizure: No Paresthesias: No  Musculoskeletal: Strength & Muscle Tone: within normal limits Gait & Station: normal Patient leans: N/A  Psychiatric Specialty Exam: Review of Systems  Constitutional: Negative.   HENT: Negative.   Musculoskeletal: Negative.   Skin: Negative.   Neurological: Positive for tremors.    Weight 224 lb (101.6 kg).Body mass index is 31.24 kg/m.  General Appearance: Casual  Eye Contact:  Good  Speech:  Clear and Coherent  Volume:  Normal  Mood:  Euthymic  Affect:  Appropriate  Thought Process:  Goal Directed  Orientation:  Full (Time, Place, and Person)  Thought Content: WDL and Logical   Suicidal Thoughts:  No  Homicidal Thoughts:  No  Memory:  Immediate;   Good Recent;   Good Remote;   Good  Judgement:  Good  Insight:  Good  Psychomotor Activity:  Tremor  Concentration:  Concentration: Good and Attention Span: Good  Recall:  Good  Fund of Knowledge: Good  Language: Good  Akathisia:  No  Handed:  Right  AIMS (if indicated):  0  Assets:  Communication Skills Desire for Improvement Housing Resilience Social Support Transportation  ADL's:  Intact  Cognition: WNL  Sleep:  fair   Assessment: Bipolar disorder type I.   Plan: Patient is a stable on his current psychiatric medication.  Though he still has mild tremors but they are not as intense and does not bother him.  Continue Zyprexa 10 mg daily, Cogentin 1 mg twice a day, Depakote 1000 mg at bedtime and Wellbutrin XL 300 mg daily.  Discussed medication side effects and benefits.  Recommended to call us back if he has any question, concern or if he feels worsening of the symptom.  Follow-up in 3 months.   Juliauna Stueve T., MD 04/08/2017, 1:47 PM

## 2017-05-02 DIAGNOSIS — Z0000 Encounter for general adult medical examination without abnormal findings: Secondary | ICD-10-CM | POA: Diagnosis not present

## 2017-05-02 DIAGNOSIS — E785 Hyperlipidemia, unspecified: Secondary | ICD-10-CM | POA: Diagnosis not present

## 2017-05-08 DIAGNOSIS — R739 Hyperglycemia, unspecified: Secondary | ICD-10-CM | POA: Diagnosis not present

## 2017-05-08 DIAGNOSIS — E785 Hyperlipidemia, unspecified: Secondary | ICD-10-CM | POA: Diagnosis not present

## 2017-05-08 DIAGNOSIS — Z0000 Encounter for general adult medical examination without abnormal findings: Secondary | ICD-10-CM | POA: Diagnosis not present

## 2017-06-19 ENCOUNTER — Other Ambulatory Visit (HOSPITAL_COMMUNITY): Payer: Self-pay | Admitting: Psychiatry

## 2017-06-19 DIAGNOSIS — F319 Bipolar disorder, unspecified: Secondary | ICD-10-CM

## 2017-07-06 ENCOUNTER — Other Ambulatory Visit (HOSPITAL_COMMUNITY): Payer: Self-pay | Admitting: Psychiatry

## 2017-07-06 DIAGNOSIS — F319 Bipolar disorder, unspecified: Secondary | ICD-10-CM

## 2017-07-10 ENCOUNTER — Encounter (HOSPITAL_COMMUNITY): Payer: Self-pay | Admitting: Psychiatry

## 2017-07-10 ENCOUNTER — Ambulatory Visit (INDEPENDENT_AMBULATORY_CARE_PROVIDER_SITE_OTHER): Payer: 59 | Admitting: Psychiatry

## 2017-07-10 DIAGNOSIS — Z811 Family history of alcohol abuse and dependence: Secondary | ICD-10-CM | POA: Diagnosis not present

## 2017-07-10 DIAGNOSIS — Z818 Family history of other mental and behavioral disorders: Secondary | ICD-10-CM

## 2017-07-10 DIAGNOSIS — Z87891 Personal history of nicotine dependence: Secondary | ICD-10-CM | POA: Diagnosis not present

## 2017-07-10 DIAGNOSIS — F3131 Bipolar disorder, current episode depressed, mild: Secondary | ICD-10-CM | POA: Diagnosis not present

## 2017-07-10 DIAGNOSIS — F319 Bipolar disorder, unspecified: Secondary | ICD-10-CM

## 2017-07-10 MED ORDER — BENZTROPINE MESYLATE 1 MG PO TABS: 1.0000 mg | ORAL_TABLET | Freq: Two times a day (BID) | ORAL | 0 refills | Status: DC

## 2017-07-10 MED ORDER — DIVALPROEX SODIUM 500 MG PO DR TAB: 1000.0000 mg | DELAYED_RELEASE_TABLET | Freq: Every day | ORAL | 0 refills | Status: DC

## 2017-07-10 MED ORDER — OLANZAPINE 15 MG PO TABS: 15.0000 mg | ORAL_TABLET | Freq: Every day | ORAL | 0 refills | Status: DC

## 2017-07-10 MED ORDER — BUPROPION HCL ER (XL) 300 MG PO TB24: ORAL_TABLET | ORAL | 0 refills | Status: DC

## 2017-07-10 NOTE — Progress Notes (Signed)
[facility] MD/PA/NP OP Progress Note  07/10/2017 2:20 PM Thomas LeavensClifford D Woodward  MRN:  [id number]  Chief Complaint:  sometime I get paranoid and poor sleep.  I think I need to go back on Zyprexa.  HPI: Thomas DyerClifford came for his follow-up appointment.  He is taking his medication but sometime he speaks.  Losing paranoia poor sleep and racing thoughts.  He used to take 20 mg Zyprexa but we'll reduce the dose because of tremors in his hand.  He believed that these tremors are chronic and did not see a huge improvement since we reduced the dose.  He like to go back on higher dose.  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  He has a IT consultantnew supervisor and is trying to adjust with him.  He denies any suicidal thoughts or homicidal thoughts but admitted irritability and paranoia.  He continues to have issues with his brother but recently there has been no recent arguments or agitation.  Is taking Wellbutrin, Cogentin, Depakote.  His last blood work which was done in family shows creatinine 1.42, hemoglobin A1c 5.2, Depakote level 44.  His CBC and other basic industries normal.  Patient denies drinking alcohol or using any illegal substances.  His appetite is okay but he continued to have issues losing weight.  He admitted that he need to watch his calorie and sweet craving.  He also admitted not able to see his therapist Thomas Woodward for a while but promised to scheduled appointment.  Visit Diagnosis:    ICD-10-CM   1. Bipolar 1 disorder, depressed (HCC) F31.9 OLANZapine (ZYPREXA) 15 MG tablet    divalproex (DEPAKOTE) 500 MG DR tablet  2. Bipolar 1 disorder, depressed, mild (HCC) F31.31 buPROPion (WELLBUTRIN XL) 300 MG 24 hr tablet    benztropine (COGENTIN) 1 MG tablet    Past Psychiatric History: Reviewed. Patient has history of multiple psychiatric hospitalization. [facility] has [facility] He has history of taking overdose on Risperdal. He has a  history of psychosis, mania, severe depression. He has done multiple intensive outpatient program. His last psychiatric hospitalization was in 2006. In the past she had tried Abilify, Seroquel, Strattera, lithium, Adderall, Celexa. He was taking Zyprexa 20 mg but does reduce to do tremors and shakes.  Patient has history of drinking and using drugs in the past.  Past Medical History:  Past Medical History:  Diagnosis Date  . GERD (gastroesophageal reflux disease)   . Hyperlipidemia     Past Surgical History:  Procedure Laterality Date  . KNEE SURGERY      Family Psychiatric History: Reviewed.  Family History:  Family History  Problem Relation Age of Onset  . Depression Mother   . Alcohol abuse Brother   . Bipolar disorder Brother     Social History:  Social History   Social History  . Marital status: Single    Spouse name: N/A  . Number of children: N/A  . Years of education: N/A   Social History Main Topics  . Smoking status: Former Smoker    Packs/day: 0.50    Years: 25.00    Quit date: 04/26/2013  . Smokeless tobacco: Never Used  . Alcohol use No  . Drug use: No  . Sexual activity: Not Currently   Other Topics Concern  . Not on file   Social History Narrative  . No narrative on file    Allergies: No Known Allergies  Metabolic Disorder Labs:  Lab Results  Component Value Date   HGBA1C 5.2 12/31/2016   MPG 103 12/31/2016   MPG 120 (H) 01/12/2015   No results found for: PROLACTIN No results found for: CHOL, TRIG, HDL, CHOLHDL, VLDL, LDLCALC Lab Results  Component Value Date   TSH 0.68 12/31/2016    Therapeutic Level Labs: Lab Results  Component Value Date   LITHIUM 0.88 04/20/2007   Lab Results  Component Value Date   VALPROATE 44.0 (L) 12/31/2016   VALPROATE 33.3 (L) 01/12/2015   No components found for:  CBMZ  Current Medications: Current Outpatient Prescriptions  Medication Sig Dispense Refill  . benztropine (COGENTIN) 1 MG tablet  Take 1 tablet (1 mg total) by mouth 2 (two) times daily. 180 tablet 0  . buPROPion (WELLBUTRIN XL) 300 MG 24 hr tablet TAKE 1 TABLET BY MOUTH EVERY DAY 90 tablet 0  . divalproex (DEPAKOTE) 500 MG DR tablet Take 2 tablets (1,000 mg total) by mouth at bedtime. 180 tablet 0  . OLANZapine (ZYPREXA) 10 MG tablet Take 1 tablet (10 mg total) by mouth at bedtime. Dose decrease 90 tablet 0  . omega-3 acid ethyl esters (LOVAZA) 1 G capsule Take 1 g by mouth 2 (two) times daily.    Marland Kitchen omeprazole (PRILOSEC) 20 MG capsule Take 20 mg by mouth daily.    Marland Kitchen terazosin (HYTRIN) 1 MG capsule Take 1 mg by mouth at bedtime.     No current facility-administered medications for this visit.      Musculoskeletal: Strength & Muscle Tone: within normal limits Gait & Station: normal Patient leans: N/A  Psychiatric Specialty Exam: ROS  Blood pressure 120/74, pulse 78, height  (1.803 m), weight 225 lb 9.6 oz (102.3 kg).There is no height or weight on file to calculate BMI.  General Appearance: Casual  Eye Contact:  Fair  Speech:  Slow  Volume:  Decreased  Mood:  Anxious  Affect:  Appropriate  Thought Process:  Goal Directed  Orientation:  Full (Time, Place, and Person)  Thought Content: Paranoid Ideation   Suicidal Thoughts:  No  Homicidal Thoughts:  No  Memory:  Immediate;   Good Recent;   Good Remote;   Good  Judgement:  Good  Insight:  Good  Psychomotor Activity:  Tremor and Mild tremors in his hand  Concentration:  Concentration: Good and Attention Span: Fair  Recall:  Fair  Fund of Knowledge: Good  Language: Good  Akathisia:  No  Handed:  Right  AIMS (if indicated): not done  Assets:  Communication Skills Desire for Improvement Housing Physical Health Resilience Social Support  ADL's:  Intact  Cognition: WNL  Sleep:  Fair   Screenings:   Assessment and Plan: Bipolar disorder type I.  Patient is experiencing paranoia and poor sleep.  He like to go back on higher dose of Zyprexa.   I recommended to try Zyprexa 15 mg since 20 mg caused tremors in his hand.  He'll continue Cogentin 1 mg twice a day, Depakote thousand milligram at bedtime and Wellbutrin XL 300 mg daily.  I encourage to restart seeing therapist Thomas Crosby for counseling.  Patient agreed with the plan.  Recommended to call us back if he has any question, concern or worsening of the symptom.  Encouraged to watch his calorie intake and to regular exercise.  Follow-up in 3 months.   Jamarl Pew T., MD 07/10/2017, 2:20 PM

## 2017-07-21 DIAGNOSIS — Z23 Encounter for immunization: Secondary | ICD-10-CM | POA: Diagnosis not present

## 2017-10-06 ENCOUNTER — Other Ambulatory Visit (HOSPITAL_COMMUNITY): Payer: Self-pay | Admitting: Psychiatry

## 2017-10-06 DIAGNOSIS — F319 Bipolar disorder, unspecified: Secondary | ICD-10-CM

## 2017-10-09 ENCOUNTER — Ambulatory Visit (HOSPITAL_COMMUNITY): Payer: Self-pay | Admitting: Psychiatry

## 2017-10-10 ENCOUNTER — Other Ambulatory Visit (HOSPITAL_COMMUNITY): Payer: Self-pay

## 2017-10-10 DIAGNOSIS — F319 Bipolar disorder, unspecified: Secondary | ICD-10-CM

## 2017-10-10 MED ORDER — OLANZAPINE 15 MG PO TABS: 15.0000 mg | ORAL_TABLET | Freq: Every day | ORAL | 0 refills | Status: DC

## 2017-10-17 ENCOUNTER — Other Ambulatory Visit (HOSPITAL_COMMUNITY): Payer: Self-pay | Admitting: Psychiatry

## 2017-10-17 DIAGNOSIS — F3131 Bipolar disorder, current episode depressed, mild: Secondary | ICD-10-CM

## 2017-11-06 DIAGNOSIS — E785 Hyperlipidemia, unspecified: Secondary | ICD-10-CM | POA: Diagnosis not present

## 2017-11-06 DIAGNOSIS — R739 Hyperglycemia, unspecified: Secondary | ICD-10-CM | POA: Diagnosis not present

## 2017-11-13 DIAGNOSIS — E7800 Pure hypercholesterolemia, unspecified: Secondary | ICD-10-CM | POA: Diagnosis not present

## 2017-11-13 DIAGNOSIS — Z0000 Encounter for general adult medical examination without abnormal findings: Secondary | ICD-10-CM | POA: Diagnosis not present

## 2017-11-25 ENCOUNTER — Encounter (HOSPITAL_COMMUNITY): Payer: Self-pay | Admitting: Psychiatry

## 2017-11-25 ENCOUNTER — Ambulatory Visit (INDEPENDENT_AMBULATORY_CARE_PROVIDER_SITE_OTHER): Payer: 59 | Admitting: Psychiatry

## 2017-11-25 DIAGNOSIS — Z818 Family history of other mental and behavioral disorders: Secondary | ICD-10-CM

## 2017-11-25 DIAGNOSIS — F3131 Bipolar disorder, current episode depressed, mild: Secondary | ICD-10-CM

## 2017-11-25 DIAGNOSIS — R251 Tremor, unspecified: Secondary | ICD-10-CM

## 2017-11-25 DIAGNOSIS — F319 Bipolar disorder, unspecified: Secondary | ICD-10-CM

## 2017-11-25 DIAGNOSIS — Z811 Family history of alcohol abuse and dependence: Secondary | ICD-10-CM

## 2017-11-25 DIAGNOSIS — F419 Anxiety disorder, unspecified: Secondary | ICD-10-CM

## 2017-11-25 DIAGNOSIS — Z79899 Other long term (current) drug therapy: Secondary | ICD-10-CM

## 2017-11-25 DIAGNOSIS — Z87891 Personal history of nicotine dependence: Secondary | ICD-10-CM

## 2017-11-25 MED ORDER — OLANZAPINE 15 MG PO TABS: 15.0000 mg | ORAL_TABLET | Freq: Every day | ORAL | 0 refills | Status: DC

## 2017-11-25 MED ORDER — BUPROPION HCL ER (XL) 300 MG PO TB24: 300.0000 mg | ORAL_TABLET | Freq: Every day | ORAL | 0 refills | Status: DC

## 2017-11-25 MED ORDER — DIVALPROEX SODIUM 500 MG PO DR TAB: 1000.0000 mg | DELAYED_RELEASE_TABLET | Freq: Every day | ORAL | 0 refills | Status: DC

## 2017-11-25 MED ORDER — BENZTROPINE MESYLATE 1 MG PO TABS: 1.0000 mg | ORAL_TABLET | Freq: Two times a day (BID) | ORAL | 0 refills | Status: DC

## 2017-11-25 NOTE — Progress Notes (Signed)
BH MD/PA/NP OP Progress Note  11/25/2017 12:58 PM Rhona LeavensClifford D Kincaid  MRN:  [id number]  Chief Complaint: I am feeling better with increase Zyprexa.  I am sleeping better.  HPI: Thomas DyerClifford came for his follow-up appointment.  On his last visit we increase Zyprexa 15 mg at bedtime.  He is seeing improvement in his sleep and anxiety.  He admitted less paranoia but continued to have some time ruminative thoughts about his life.  He gets worried about his future.  He is single and lonely and not sure if he ever get married.  He admitted getting along very well with his new supervisor.  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  He likes his job.  He is taking Wellbutrin, Cogentin, Depakote and Zyprexa.  He has mild tremors in his hand.  He does not feel it interferes in his daily activity.  He denies any hallucination or any suicidal thoughts.  His appetite is okay.  He had a quite Christmas.  He has no issues with his brother.  Patient denies drinking alcohol or using any illegal substances.  Lately he has unable to see his therapist Ollen Grossllen Wilson because of transportation.  His car is broken down and using public transport and his therapist is not close to bus stop.  Like to continue his current medication.  Visit Diagnosis:    ICD-10-CM   1. Bipolar 1 disorder, depressed (HCC) F31.9 OLANZapine (ZYPREXA) 15 MG tablet    divalproex (DEPAKOTE) 500 MG DR tablet  2. Bipolar 1 disorder, depressed, mild (HCC) F31.31 buPROPion (WELLBUTRIN XL) 300 MG 24 hr tablet    benztropine (COGENTIN) 1 MG tablet    Past Psychiatric History: Reviewed Patient has history of multiple psychiatric hospitalization. He has [facility]. He has history of taking overdose on Risperdal. He has a history of psychosis, mania, severe depression. He has done multiple intensive outpatient program. His last psychiatric hospitalization was in 2006. In the past she  had tried Abilify, Seroquel, Strattera, lithium, Adderall, Celexa. He was taking Zyprexa 20 mg but does reduce to do tremors and shakes. Patient has history of drinking and using drugs in the past.  Past Medical History:  Past Medical History:  Diagnosis Date  . GERD (gastroesophageal reflux disease)   . Hyperlipidemia     Past Surgical History:  Procedure Laterality Date  . KNEE SURGERY      Family Psychiatric History: Reviewed.  Family History:  Family History  Problem Relation Age of Onset  . Depression Mother   . Alcohol abuse Brother   . Bipolar disorder Brother     Social History:  Social History   Socioeconomic History  . Marital status: Single    Spouse name: Not on file  . Number of children: Not on file  . Years of education: Not on file  . Highest education level: Not on file  Social Needs  . Financial resource strain: Not on file  . Food insecurity - worry: Not on file  . Food insecurity - inability: Not on file  . Transportation needs - medical: Not on file  . Transportation needs - non-medical: Not on file  Occupational History  . Not on file  Tobacco Use  . Smoking status: Former Smoker    Packs/day: 0.50    Years: 25.00    Pack years: 12.50    Last attempt to quit: 04/26/2013    Years since quitting: 4.5  .  Smokeless tobacco: Never Used  Substance and Sexual Activity  . Alcohol use: No    Alcohol/week: 0.0 oz  . Drug use: No  . Sexual activity: Not Currently  Other Topics Concern  . Not on file  Social History Narrative  . Not on file    Allergies: No Known Allergies  Metabolic Disorder Labs: Lab Results  Component Value Date   HGBA1C 5.2 12/31/2016   MPG 103 12/31/2016   MPG 120 (H) 01/12/2015   No results found for: PROLACTIN No results found for: CHOL, TRIG, HDL, CHOLHDL, VLDL, LDLCALC Lab Results  Component Value Date   TSH 0.68 12/31/2016    Therapeutic Level Labs: Lab Results  Component Value Date   LITHIUM 0.88  04/20/2007   Lab Results  Component Value Date   VALPROATE 44.0 (L) 12/31/2016   VALPROATE 33.3 (L) 01/12/2015   No components found for:  CBMZ  Current Medications: Current Outpatient Medications  Medication Sig Dispense Refill  . benztropine (COGENTIN) 1 MG tablet Take 1 tablet (1 mg total) by mouth 2 (two) times daily. 180 tablet 0  . buPROPion (WELLBUTRIN XL) 300 MG 24 hr tablet TAKE 1 TABLET BY MOUTH EVERY DAY 90 tablet 0  . divalproex (DEPAKOTE) 500 MG DR tablet Take 2 tablets (1,000 mg total) by mouth at bedtime. 180 tablet 0  . OLANZapine (ZYPREXA) 15 MG tablet Take 1 tablet (15 mg total) by mouth at bedtime. Dose decrease 90 tablet 0  . omega-3 acid ethyl esters (LOVAZA) 1 G capsule Take 1 g by mouth 2 (two) times daily.    Marland Kitchen omeprazole (PRILOSEC) 20 MG capsule Take 20 mg by mouth daily.    Marland Kitchen terazosin (HYTRIN) 1 MG capsule Take 1 mg by mouth at bedtime.     No current facility-administered medications for this visit.      Musculoskeletal: Strength & Muscle Tone: within normal limits Gait & Station: normal Patient leans: N/A  Psychiatric Specialty Exam: ROS  There were no vitals taken for this visit.There is no height or weight on file to calculate BMI.  General Appearance: Casual  Eye Contact:  Fair  Speech:  Clear and Coherent  Volume:  Decreased  Mood:  Anxious  Affect:  Congruent  Thought Process:  Goal Directed  Orientation:  Full (Time, Place, and Person)  Thought Content: Logical and Rumination   Suicidal Thoughts:  No  Homicidal Thoughts:  No  Memory:  Immediate;   Fair Recent;   Good Remote;   Good  Judgement:  Good  Insight:  Good  Psychomotor Activity:  Tremor  Concentration:  Concentration: Fair and Attention Span: Fair  Recall:  Good  Fund of Knowledge: Good  Language: Good  Akathisia:  No  Handed:  Right  AIMS (if indicated): not done  Assets:  Communication Skills Desire for Improvement Housing  ADL's:  Intact  Cognition: WNL   Sleep:  Good   Screenings:   Assessment and Plan: Bipolar disorder type I.  Anxiety disorder NOS.  Patient doing better since Zyprexa dose increase.  He is less paranoid and his sleep is improved.  Continue Zyprexa 15 mg at bedtime, Cogentin 1 mg twice a day, Depakote 1000 mg at bedtime and Wellbutrin XL 300 mg daily.  Patient is hoping to have his car fixed soon so he can restart therapy with Ollen Gross.  Recommended to call us back if is any question or any concern.  Discussed healthy lifestyle and watch his calorie intake and do regular  exercise.  Follow-up in 3 months   Cleotis Nipper, MD 11/25/2017, 12:58 PM

## 2018-02-12 ENCOUNTER — Ambulatory Visit (HOSPITAL_COMMUNITY): Payer: Medicare Other | Admitting: Psychiatry

## 2018-02-17 ENCOUNTER — Ambulatory Visit (INDEPENDENT_AMBULATORY_CARE_PROVIDER_SITE_OTHER): Payer: Medicare Other | Admitting: Psychiatry

## 2018-02-17 ENCOUNTER — Encounter (HOSPITAL_COMMUNITY): Payer: Self-pay | Admitting: Psychiatry

## 2018-02-17 VITALS — BP 122/80 | HR 75 | Ht 72.0 in | Wt 236.0 lb

## 2018-02-17 DIAGNOSIS — Z811 Family history of alcohol abuse and dependence: Secondary | ICD-10-CM

## 2018-02-17 DIAGNOSIS — F319 Bipolar disorder, unspecified: Secondary | ICD-10-CM | POA: Diagnosis not present

## 2018-02-17 DIAGNOSIS — K760 Fatty (change of) liver, not elsewhere classified: Secondary | ICD-10-CM | POA: Diagnosis not present

## 2018-02-17 DIAGNOSIS — R251 Tremor, unspecified: Secondary | ICD-10-CM | POA: Diagnosis not present

## 2018-02-17 DIAGNOSIS — Z79899 Other long term (current) drug therapy: Secondary | ICD-10-CM | POA: Diagnosis not present

## 2018-02-17 DIAGNOSIS — Z915 Personal history of self-harm: Secondary | ICD-10-CM | POA: Diagnosis not present

## 2018-02-17 DIAGNOSIS — Z818 Family history of other mental and behavioral disorders: Secondary | ICD-10-CM

## 2018-02-17 DIAGNOSIS — Z87891 Personal history of nicotine dependence: Secondary | ICD-10-CM

## 2018-02-17 DIAGNOSIS — F3131 Bipolar disorder, current episode depressed, mild: Secondary | ICD-10-CM

## 2018-02-17 MED ORDER — BUPROPION HCL ER (XL) 300 MG PO TB24: 300.0000 mg | ORAL_TABLET | Freq: Every day | ORAL | 0 refills | Status: DC

## 2018-02-17 MED ORDER — DIVALPROEX SODIUM 500 MG PO DR TAB: 1000.0000 mg | DELAYED_RELEASE_TABLET | Freq: Every day | ORAL | 0 refills | Status: DC

## 2018-02-17 MED ORDER — BENZTROPINE MESYLATE 1 MG PO TABS: 1.0000 mg | ORAL_TABLET | Freq: Two times a day (BID) | ORAL | 0 refills | Status: DC

## 2018-02-17 MED ORDER — OLANZAPINE 15 MG PO TABS: 15.0000 mg | ORAL_TABLET | Freq: Every day | ORAL | 0 refills | Status: DC

## 2018-02-17 NOTE — Progress Notes (Signed)
BH MD/PA/NP OP Progress Note  02/17/2018 1:47 PM Thomas Woodward  MRN:  [id number]  Chief Complaint: I am doing better.  I am getting along with my new supervisor.  HPI: Thomas Woodward came for his follow-up appointment.  He is taking his medication as prescribed.  He reported that he is getting along much better with his new supervisor.  He likes his job.  Patient is working as a Nature conservation officer at [employer].  He has mild tremors in his hand but that does not bother him.  He is sleeping good.  He denies any paranoia, hallucination or any suicidal thoughts.  Sometime he gets anxious when he is in the church with too many people.  But he denies any crying spells, irritability, feeling of hopelessness or worthlessness.  He is concerned about his brother who is having a lot of health issues and recently had surgery.  Patient admitted some time unable to see therapist Thomas Woodward because of transportation.  He is using public transport.  His energy level is good.  Patient denies drinking alcohol or using any illegal substances.  He has not done blood work since last year.  Visit Diagnosis:    ICD-10-CM   1. Bipolar 1 disorder, depressed (HCC) F31.9 OLANZapine (ZYPREXA) 15 MG tablet    divalproex (DEPAKOTE) 500 MG DR tablet    buPROPion (WELLBUTRIN XL) 300 MG 24 hr tablet    benztropine (COGENTIN) 1 MG tablet    Hemoglobin A1c    CBC with Differential/Platelet    Comprehensive metabolic panel    Valproic acid level  2. Bipolar 1 disorder, depressed, mild (HCC) F31.31   3. Encounter for long-term (current) use of medications Z79.899 Hemoglobin A1c    CBC with Differential/Platelet    Comprehensive metabolic panel    Valproic acid level    Past Psychiatric History: Reviewed. Patient has history of multiple psychiatric hospitalization. He has several admission from 1994-1995 at [facility] and [name]. He has history of taking overdose on Risperdal. He has a history of psychosis,  mania, severe depression. He has done multiple intensive outpatient program. His last psychiatric hospitalization was in 2006. In the past she had tried Abilify, Seroquel, Strattera, lithium, Adderall, Celexa. He was taking Zyprexa 20 mg but does reduce to do tremors and shakes. Patient has history of drinking and using drugs in the past.  Past Medical History:  Past Medical History:  Diagnosis Date  . GERD (gastroesophageal reflux disease)   . Hyperlipidemia     Past Surgical History:  Procedure Laterality Date  . KNEE SURGERY      Family Psychiatric History: Reviewed  Family History:  Family History  Problem Relation Age of Onset  . Depression Mother   . Alcohol abuse Brother   . Bipolar disorder Brother     Social History:  Social History   Socioeconomic History  . Marital status: Single    Spouse name: Not on file  . Number of children: Not on file  . Years of education: Not on file  . Highest education level: Not on file  Occupational History  . Not on file  Social Needs  . Financial resource strain: Not on file  . Food insecurity:    Worry: Not on file    Inability: Not on file  . Transportation needs:    Medical: Not on file    Non-medical: Not on file  Tobacco Use  . Smoking status: Former Smoker    Packs/day: 0.50  Years: 25.00    Pack years: 12.50    Last attempt to quit: 04/26/2013    Years since quitting: 4.8  . Smokeless tobacco: Never Used  Substance and Sexual Activity  . Alcohol use: No    Alcohol/week: 0.0 oz  . Drug use: No  . Sexual activity: Not Currently  Lifestyle  . Physical activity:    Days per week: Not on file    Minutes per session: Not on file  . Stress: Not on file  Relationships  . Social connections:    Talks on phone: Not on file    Gets together: Not on file    Attends religious service: Not on file    Active member of club or organization: Not on file    Attends meetings of clubs or organizations: Not on file     Relationship status: Not on file  Other Topics Concern  . Not on file  Social History Narrative  . Not on file    Allergies: No Known Allergies  Metabolic Disorder Labs: Lab Results  Component Value Date   HGBA1C 5.2 12/31/2016   MPG 103 12/31/2016   MPG 120 (H) 01/12/2015   No results found for: PROLACTIN No results found for: CHOL, TRIG, HDL, CHOLHDL, VLDL, LDLCALC Lab Results  Component Value Date   TSH 0.68 12/31/2016    Therapeutic Level Labs: Lab Results  Component Value Date   LITHIUM 0.88 04/20/2007   Lab Results  Component Value Date   VALPROATE 44.0 (L) 12/31/2016   VALPROATE 33.3 (L) 01/12/2015   No components found for:  CBMZ  Current Medications: Current Outpatient Medications  Medication Sig Dispense Refill  . benztropine (COGENTIN) 1 MG tablet Take 1 tablet (1 mg total) by mouth 2 (two) times daily. 180 tablet 0  . buPROPion (WELLBUTRIN XL) 300 MG 24 hr tablet Take 1 tablet (300 mg total) by mouth daily. 90 tablet 0  . divalproex (DEPAKOTE) 500 MG DR tablet Take 2 tablets (1,000 mg total) by mouth at bedtime. 180 tablet 0  . OLANZapine (ZYPREXA) 15 MG tablet Take 1 tablet (15 mg total) by mouth at bedtime. Dose decrease 90 tablet 0  . omega-3 acid ethyl esters (LOVAZA) 1 G capsule Take 1 g by mouth 2 (two) times daily.    Marland Kitchen omeprazole (PRILOSEC) 20 MG capsule Take 20 mg by mouth daily.    Marland Kitchen terazosin (HYTRIN) 1 MG capsule Take 1 mg by mouth at bedtime.     No current facility-administered medications for this visit.      Musculoskeletal: Strength & Muscle Tone: within normal limits Gait & Station: normal Patient leans: N/A  Psychiatric Specialty Exam: ROS  Blood pressure 122/80, pulse 75, height 6' (1.829 m), weight 236 lb (107 kg).Body mass index is 32.01 kg/m.  General Appearance: Casual  Eye Contact:  Good  Speech:  Clear and Coherent  Volume:  Normal  Mood:  Euthymic  Affect:  Appropriate  Thought Process:  Goal Directed   Orientation:  Full (Time, Place, and Person)  Thought Content: Rumination   Suicidal Thoughts:  No  Homicidal Thoughts:  No  Memory:  Immediate;   Good Recent;   Good Remote;   Good  Judgement:  Good  Insight:  Good  Psychomotor Activity:  Tremor  Concentration:  Concentration: Fair and Attention Span: Fair  Recall:  Good  Fund of Knowledge: Good  Language: Good  Akathisia:  No  Handed:  Right  AIMS (if indicated): not done  Assets:  Communication Skills Desire for Improvement Housing Resilience Social Support  ADL's:  Intact  Cognition: WNL  Sleep:  Fair   Screenings:   Assessment and Plan: Bipolar disorder type I.  Fatty disorder NOS.  Patient doing better on Zyprexa.  Continue Zyprexa 15 mg at bedtime, Cogentin 1 mg twice a day, Depakote thousand milligrams at bedtime and Wellbutrin XL 300 mg daily.  We will do blood work including Depakote level.  Discussed healthy lifestyle and watch his calorie intake.  Recommended to call us back if he has any question or any concern.  Follow-up in 3 months.   Cleotis NipperSyed T Sherika Kubicki, MD 02/17/2018, 1:47 PM

## 2018-02-18 LAB — COMPREHENSIVE METABOLIC PANEL
ALBUMIN: 4.5 g/dL (ref 3.5–5.5)
ALT: 15 IU/L (ref 0–44)
AST: 11 IU/L (ref 0–40)
Albumin/Globulin Ratio: 2.1 (ref 1.2–2.2)
Alkaline Phosphatase: 50 IU/L (ref 39–117)
BUN / CREAT RATIO: 15 (ref 9–20)
BUN: 19 mg/dL (ref 6–24)
Bilirubin Total: <0.2 mg/dL (ref 0.0–1.2)
CO2: 22 mmol/L (ref 20–29)
CREATININE: 1.29 mg/dL — AB (ref 0.76–1.27)
Calcium: 9.5 mg/dL (ref 8.7–10.2)
Chloride: 108 mmol/L — ABNORMAL HIGH (ref 96–106)
GFR calc Af Amer: 78 mL/min/{1.73_m2} (ref >59)
GFR calc non Af Amer: 67 mL/min/{1.73_m2} (ref >59)
GLUCOSE: 109 mg/dL — AB (ref 65–99)
Globulin, Total: 2.1 g/dL (ref 1.5–4.5)
Potassium: 4.8 mmol/L (ref 3.5–5.2)
Sodium: 145 mmol/L — ABNORMAL HIGH (ref 134–144)
TOTAL PROTEIN: 6.6 g/dL (ref 6.0–8.5)

## 2018-02-18 LAB — CBC WITH DIFFERENTIAL/PLATELET
BASOS ABS: 0.1 10*3/uL (ref 0.0–0.2)
Basos: 1 %
EOS (ABSOLUTE): 0.2 10*3/uL (ref 0.0–0.4)
EOS: 4 %
HEMOGLOBIN: 13.6 g/dL (ref 13.0–17.7)
Hematocrit: 41 % (ref 37.5–51.0)
IMMATURE GRANS (ABS): 0 10*3/uL (ref 0.0–0.1)
IMMATURE GRANULOCYTES: 0 %
LYMPHS ABS: 1.5 10*3/uL (ref 0.7–3.1)
LYMPHS: 31 %
MCH: 29.8 pg (ref 26.6–33.0)
MCHC: 33.2 g/dL (ref 31.5–35.7)
MCV: 90 fL (ref 79–97)
MONOCYTES: 6 %
Monocytes Absolute: 0.3 10*3/uL (ref 0.1–0.9)
NEUTROS PCT: 58 %
Neutrophils Absolute: 2.7 10*3/uL (ref 1.4–7.0)
Platelets: 223 10*3/uL (ref 150–379)
RBC: 4.56 x10E6/uL (ref 4.14–5.80)
RDW: 13.9 % (ref 12.3–15.4)
WBC: 4.7 10*3/uL (ref 3.4–10.8)

## 2018-02-18 LAB — HEMOGLOBIN A1C
ESTIMATED AVERAGE GLUCOSE: 108 mg/dL
HEMOGLOBIN A1C: 5.4 % (ref 4.8–5.6)

## 2018-02-18 LAB — VALPROIC ACID LEVEL: VALPROIC ACID LVL: 40 ug/mL — AB (ref 50–100)

## 2018-02-24 ENCOUNTER — Ambulatory Visit (HOSPITAL_COMMUNITY): Payer: Medicare Other | Admitting: Psychiatry

## 2018-03-14 DIAGNOSIS — J40 Bronchitis, not specified as acute or chronic: Secondary | ICD-10-CM | POA: Diagnosis not present

## 2018-04-20 ENCOUNTER — Other Ambulatory Visit (HOSPITAL_COMMUNITY): Payer: Self-pay | Admitting: Psychiatry

## 2018-04-20 DIAGNOSIS — F3131 Bipolar disorder, current episode depressed, mild: Secondary | ICD-10-CM

## 2018-05-13 DIAGNOSIS — E7800 Pure hypercholesterolemia, unspecified: Secondary | ICD-10-CM | POA: Diagnosis not present

## 2018-05-13 DIAGNOSIS — Z0000 Encounter for general adult medical examination without abnormal findings: Secondary | ICD-10-CM | POA: Diagnosis not present

## 2018-05-19 ENCOUNTER — Ambulatory Visit (HOSPITAL_COMMUNITY): Payer: Self-pay | Admitting: Psychiatry

## 2018-05-20 DIAGNOSIS — Z0000 Encounter for general adult medical examination without abnormal findings: Secondary | ICD-10-CM | POA: Diagnosis not present

## 2018-05-20 DIAGNOSIS — K219 Gastro-esophageal reflux disease without esophagitis: Secondary | ICD-10-CM | POA: Diagnosis not present

## 2018-05-20 DIAGNOSIS — E785 Hyperlipidemia, unspecified: Secondary | ICD-10-CM | POA: Diagnosis not present

## 2018-05-20 DIAGNOSIS — R739 Hyperglycemia, unspecified: Secondary | ICD-10-CM | POA: Diagnosis not present

## 2018-06-06 ENCOUNTER — Other Ambulatory Visit (HOSPITAL_COMMUNITY): Payer: Self-pay | Admitting: Psychiatry

## 2018-06-06 DIAGNOSIS — F319 Bipolar disorder, unspecified: Secondary | ICD-10-CM

## 2018-07-01 ENCOUNTER — Ambulatory Visit (INDEPENDENT_AMBULATORY_CARE_PROVIDER_SITE_OTHER): Payer: Medicare Other | Admitting: Psychiatry

## 2018-07-01 ENCOUNTER — Encounter (HOSPITAL_COMMUNITY): Payer: Self-pay | Admitting: Psychiatry

## 2018-07-01 VITALS — BP 124/80 | HR 82 | Ht 72.0 in | Wt 243.0 lb

## 2018-07-01 DIAGNOSIS — G4719 Other hypersomnia: Secondary | ICD-10-CM | POA: Diagnosis not present

## 2018-07-01 DIAGNOSIS — Z79899 Other long term (current) drug therapy: Secondary | ICD-10-CM

## 2018-07-01 DIAGNOSIS — G4733 Obstructive sleep apnea (adult) (pediatric): Secondary | ICD-10-CM

## 2018-07-01 DIAGNOSIS — Z87891 Personal history of nicotine dependence: Secondary | ICD-10-CM

## 2018-07-01 DIAGNOSIS — F319 Bipolar disorder, unspecified: Secondary | ICD-10-CM

## 2018-07-01 DIAGNOSIS — Z818 Family history of other mental and behavioral disorders: Secondary | ICD-10-CM

## 2018-07-01 MED ORDER — BENZTROPINE MESYLATE 1 MG PO TABS: 1.0000 mg | ORAL_TABLET | Freq: Every day | ORAL | 0 refills | Status: DC

## 2018-07-01 MED ORDER — OLANZAPINE 10 MG PO TABS: 10.0000 mg | ORAL_TABLET | Freq: Every day | ORAL | 0 refills | Status: DC

## 2018-07-01 MED ORDER — DIVALPROEX SODIUM 500 MG PO DR TAB: 1000.0000 mg | DELAYED_RELEASE_TABLET | Freq: Every day | ORAL | 0 refills | Status: DC

## 2018-07-01 NOTE — Progress Notes (Signed)
BH MD/PA/NP OP Progress Note  07/01/2018 10:31 AM Thomas Woodward  MRN:  [id number]  Chief Complaint:  Chief Complaint    Follow-up; Depression; Anxiety; Agitation     Subjective:  My mood gets to me after a while.  I am able to talk to my brother a little bit more.   HPI: Thomas Woodward came for his follow-up appointment.  He is taking his medication as prescribed. Takes Depakote and Zyprexa between 8-9 PM, Wellbutrin XL 300 mg when he wakes up if he remembers, endorses forgetting often.  He takes Cogentin BID, and denies EPS symptoms.  He reports that he does not usually eat breakfast or dinner, and overeats and sometimes binges at night. Bedtime is at 11PM -2 AM, and he sleeps in a lot. He thinks medications are making him more sedated and "hung over in the morning". He works 9:30 AM Friday and Monday, and at noon Thursday.   He states he would like to be more structured, but "can't change it.  I am tired all the time."  Patient states that he snores and used to be on CPAP, but has not used in > 2 years.  He is "living in close quarters with mom, brother and brother's 43 year old son in a 2 bedroom apartment and a den.  [name] stays in [place], with shower curtain up for privacy. His mother has expressed concern about his breathing at night. Thomas Woodward works at [employer], where he works primarily in [employer]. He likes his job, and has reports no problems at work.  He has mild tremors in his hand but that does not bother him.  He denies any paranoia, hallucination or any suicidal thoughts.  Sometime he gets anxious when he is in the church with too many people.  But he denies any crying spells, irritability, feeling of hopelessness or worthlessness.  His energy level is low from being tired.  Patient denies drinking alcohol or using any illegal substances.  He has been compliant with labs and last PCP visit July 2019 had cholesterol levels, were OK. 02/17/2018 Hgb A1c, CBC and CMP reviewed and were  acceptable, Valproic acid level was low.  Patient reports he has been out of Depakote for 3 days prior to this visit and has had increased irritability.  Patient admitted some time unable to see therapist Thomas Woodward because of transportation.  He is using public transport.  [facility], as he can take the bus easily here.    Visit Diagnosis:    ICD-10-CM   1. Bipolar 1 disorder, depressed (HCC) F31.9 OLANZapine (ZYPREXA) 10 MG tablet    divalproex (DEPAKOTE) 500 MG DR tablet    benztropine (COGENTIN) 1 MG tablet    Ambulatory referral to Sleep Studies  2. OSA (obstructive sleep apnea) G47.33   3. Excessive daytime sleepiness G47.19   4. Encounter for medication management [phone]     Past Psychiatric History: Reviewed. Patient has history of multiple psychiatric hospitalization. He has [facility]. He has history of taking overdose on Risperdal. He has a history of psychosis, mania, severe depression. He has done multiple intensive outpatient program. His last psychiatric hospitalization was in 2006. In the past she had tried Abilify, Seroquel, Strattera, lithium, Adderall, Celexa. He was taking Zyprexa 20 mg but does reduce to do tremors and shakes. Patient has history of drinking and using drugs in the  past.  Past Medical History:  Past Medical History:  Diagnosis Date  . GERD (gastroesophageal reflux disease)   . Hyperlipidemia     Past Surgical History:  Procedure Laterality Date  . KNEE SURGERY      Family Psychiatric History: Reviewed  Family History:  Family History  Problem Relation Age of Onset  . Depression Mother   . Alcohol abuse Brother   . Bipolar disorder Brother     Social History:   Live in close quarters with mom, brother and brother's 26 year old son. (2019) 2 bedroom apartment and a den.   [name] stays in [place]. Works at Public affairs consultant, works in  [employer].  Works 9:30 AM Friday and Monday Works at noon Thursday.  Social History   Socioeconomic History  . Marital status: Single    Spouse name: Not on file  . Number of children: Not on file  . Years of education: Not on file  . Highest education level: Not on file  Occupational History  . Not on file  Social Needs  . Financial resource strain: Not on file  . Food insecurity:    Worry: Not on file    Inability: Not on file  . Transportation needs:    Medical: Not on file    Non-medical: Not on file  Tobacco Use  . Smoking status: Former Smoker    Packs/day: 0.50    Years: 25.00    Pack years: 12.50    Last attempt to quit: 04/26/2013    Years since quitting: 5.1  . Smokeless tobacco: Never Used  Substance and Sexual Activity  . Alcohol use: No    Alcohol/week: 0.0 standard drinks  . Drug use: No  . Sexual activity: Not Currently  Lifestyle  . Physical activity:    Days per week: Not on file    Minutes per session: Not on file  . Stress: Not on file  Relationships  . Social connections:    Talks on phone: Not on file    Gets together: Not on file    Attends religious service: Not on file    Active member of club or organization: Not on file    Attends meetings of clubs or organizations: Not on file    Relationship status: Not on file  Other Topics Concern  . Not on file  Social History Narrative  . Not on file    Allergies: No Known Allergies  Metabolic Disorder Labs: Lab Results  Component Value Date   HGBA1C 5.4 02/17/2018   MPG 103 12/31/2016   MPG 120 (H) 01/12/2015   No results found for: PROLACTIN No results found for: CHOL, TRIG, HDL, CHOLHDL, VLDL, LDLCALC Lab Results  Component Value Date   TSH 0.68 12/31/2016    Therapeutic Level Labs: Lab Results  Component Value Date   LITHIUM 0.88 04/20/2007   Lab Results  Component Value Date   VALPROATE 40 (L) 02/17/2018   VALPROATE 44.0 (L) 12/31/2016   No components found for:   CBMZ  Current Medications: Current Outpatient Medications  Medication Sig Dispense Refill  . benztropine (COGENTIN) 1 MG tablet Take 1 tablet (1 mg total) by mouth 2 (two) times daily. 180 tablet 0  . buPROPion (WELLBUTRIN XL) 300 MG 24 hr tablet TAKE 1 TABLET BY MOUTH EVERY DAY 90 tablet 0  . divalproex (DEPAKOTE) 500 MG DR tablet Take 2 tablets (1,000 mg total) by mouth at bedtime. 180 tablet 0  . OLANZapine (ZYPREXA) 15 MG tablet Take  1 tablet (15 mg total) by mouth at bedtime. Dose decrease 90 tablet 0  . omega-3 acid ethyl esters (LOVAZA) 1 G capsule Take 1 g by mouth 2 (two) times daily.    Marland Kitchen omeprazole (PRILOSEC) 20 MG capsule Take 20 mg by mouth daily.    Marland Kitchen terazosin (HYTRIN) 1 MG capsule Take 1 mg by mouth at bedtime.     No current facility-administered medications for this visit.      Musculoskeletal: Strength & Muscle Tone: within normal limits Gait & Station: normal Patient leans: N/A  Psychiatric Specialty Exam: Review of Systems  Constitutional: Negative.   Respiratory: Negative.   Cardiovascular: Negative.   Gastrointestinal:       Eats a lot at night, feels like he binges.    Psychiatric/Behavioral: Positive for depression and suicidal ideas (passive, "don't know if I can handle things"). Negative for hallucinations, memory loss and substance abuse. The patient is nervous/anxious (fearful and feels stuck) and has insomnia.   Baseline mental status per patient  Blood pressure 124/80, pulse 82, height 6' (1.829 m), weight 243 lb (110.2 kg).Body mass index is 32.96 kg/m.  General Appearance: Casual  Eye Contact:  Good  Speech:  Clear and Coherent and Normal Rate  Volume:  Decreased  Mood:  Dysphoric  Affect:  Depressed and Flat  Thought Process:  Goal Directed and Linear  Orientation:  Full (Time, Place, and Person)  Thought Content: Hallucinations: None and Rumination   Suicidal Thoughts:  No  Homicidal Thoughts:  No  Memory:  Immediate;   Good Recent;    Good Remote;   Good  Judgement:  Good  Insight:  Good  Psychomotor Activity:  Tremor  Concentration:  Concentration: Fair and Attention Span: Fair  Recall:  Good  Fund of Knowledge: Good  Language: Good  Akathisia:  No  Handed:  Right  AIMS (if indicated): not done  Assets:  Communication Skills Desire for Improvement Housing Resilience Social Support Vocational/Educational  ADL's:  Intact  Cognition: WNL  Sleep:  Fair   Screenings:  Ambulatory referral for sleep provider.  Patient will likely need to re-qualify for CPAP.    Assessment and Plan: Bipolar disorder type I.  OSA. Excessive daytime fatigue, related to CPAP non-compliance and possible medication side effect.   Will decrease Zyprexa 10 mg at bedtime, decrease Cogentin 1 mg at bedtime and consider change to PRN, continue Depakote 1000 mg at bedtime, can consider increase to 1500 mg (and ER formulation if covered by insurance- patient does not desire change at this time) at HS to reach therapeutic target while decreasing Zyprexa.  Continue Wellbutrin XL 300 mg daily, and encouraged regular daily use. He should resume compliance with CPAP. Reviewed physiologic effects of untreated OSA. Discussed healthy lifestyle, importance of sleep and wake regulation and watch his calorie intake.  Recommended to call us back if he has any question or any concern.    Time spent 25 minutes.  More than 50% of the time spent in medication education, psychoeducation, counseling and coordination of care.  Discuss safety plan that anytime having active suicidal thoughts or homicidal thoughts then patient need to call 911 or go to the local emergency room.   Follow-up in 3 months.   Mariel Craft, MD 07/01/2018, 10:31 AM

## 2018-07-05 ENCOUNTER — Other Ambulatory Visit (HOSPITAL_COMMUNITY): Payer: Self-pay | Admitting: Psychiatry

## 2018-07-05 DIAGNOSIS — F319 Bipolar disorder, unspecified: Secondary | ICD-10-CM

## 2018-07-14 ENCOUNTER — Other Ambulatory Visit (HOSPITAL_COMMUNITY): Payer: Self-pay

## 2018-07-14 DIAGNOSIS — F319 Bipolar disorder, unspecified: Secondary | ICD-10-CM

## 2018-07-14 MED ORDER — BENZTROPINE MESYLATE 1 MG PO TABS: 1.0000 mg | ORAL_TABLET | Freq: Every day | ORAL | 0 refills | Status: DC

## 2018-09-07 DIAGNOSIS — J029 Acute pharyngitis, unspecified: Secondary | ICD-10-CM | POA: Diagnosis not present

## 2018-09-07 DIAGNOSIS — J069 Acute upper respiratory infection, unspecified: Secondary | ICD-10-CM | POA: Diagnosis not present

## 2018-09-07 DIAGNOSIS — M791 Myalgia, unspecified site: Secondary | ICD-10-CM | POA: Diagnosis not present

## 2018-09-15 DIAGNOSIS — H52223 Regular astigmatism, bilateral: Secondary | ICD-10-CM | POA: Diagnosis not present

## 2018-09-15 DIAGNOSIS — H43813 Vitreous degeneration, bilateral: Secondary | ICD-10-CM | POA: Diagnosis not present

## 2018-09-22 DIAGNOSIS — J069 Acute upper respiratory infection, unspecified: Secondary | ICD-10-CM | POA: Diagnosis not present

## 2018-09-22 DIAGNOSIS — Z23 Encounter for immunization: Secondary | ICD-10-CM | POA: Diagnosis not present

## 2018-09-27 ENCOUNTER — Other Ambulatory Visit (HOSPITAL_COMMUNITY): Payer: Self-pay | Admitting: Family

## 2018-09-27 DIAGNOSIS — F3131 Bipolar disorder, current episode depressed, mild: Secondary | ICD-10-CM

## 2018-09-29 ENCOUNTER — Ambulatory Visit (INDEPENDENT_AMBULATORY_CARE_PROVIDER_SITE_OTHER): Payer: Medicare Other | Admitting: Psychiatry

## 2018-09-29 ENCOUNTER — Encounter (HOSPITAL_COMMUNITY): Payer: Self-pay | Admitting: Psychiatry

## 2018-09-29 VITALS — BP 126/72 | Ht 71.0 in | Wt 233.0 lb

## 2018-09-29 DIAGNOSIS — F319 Bipolar disorder, unspecified: Secondary | ICD-10-CM

## 2018-09-29 DIAGNOSIS — F411 Generalized anxiety disorder: Secondary | ICD-10-CM

## 2018-09-29 MED ORDER — OLANZAPINE 10 MG PO TABS: 10.0000 mg | ORAL_TABLET | Freq: Every day | ORAL | 0 refills | Status: DC

## 2018-09-29 MED ORDER — BUPROPION HCL ER (XL) 300 MG PO TB24: 300.0000 mg | ORAL_TABLET | Freq: Every day | ORAL | 0 refills | Status: DC

## 2018-09-29 MED ORDER — DIVALPROEX SODIUM 500 MG PO DR TAB: 500.0000 mg | DELAYED_RELEASE_TABLET | Freq: Two times a day (BID) | ORAL | 0 refills | Status: DC

## 2018-09-29 NOTE — Progress Notes (Signed)
BH MD/PA/NP OP Progress Note  09/29/2018 10:02 AM Thomas Woodward  MRN:  [id number]  Chief Complaint: I am feeling better.  I have more energy.  HPI: Thomas Woodward came for his follow-up appointment.  He was last seen by covering psychiatrist in August.  At that time he was complaining of excessive sedation, drowsiness, hangover and have no energy and motivation to do things.  He was unable to go to his second job job at [employer].  His primary job is at [employer].  His medicines were reduced and Zyprexa changed from 20 mg to 10 mg and Cogentin was discontinued as patient complained that even taking the Cogentin does not help his tremors.  He is feeling better.  He is more motivated and energetic.  He is started to go back to his job which he always like it.  However he has not seeing his therapist Thomas Woodward because he feels things are going very well.  He has chronic issues with his brother and some time he gets into argument and he is hoping his lease is up and he moved out soon.  Patient denies any mania, psychosis or any hallucination.  He is sleeping better but did not get sleep studies which was recommended on his last visit.  Denies any feeling of hopelessness or worthlessness or any crying spells.  He like to continue his current psychiatric medication.  He denies drinking or using any illegal substances.  He is able to lose weight since the Zyprexa dose cut down and he is noticed decreased appetite but his energy level is good.  He had obstructive sleep apnea and he used to take CPAP but has not done sleep study which was recommended on his last visit.  Visit Diagnosis:    ICD-10-CM   1. GAD (generalized anxiety disorder) F41.1 buPROPion (WELLBUTRIN XL) 300 MG 24 hr tablet  2. Bipolar 1 disorder, depressed (HCC) F31.9 OLANZapine (ZYPREXA) 10 MG tablet    divalproex (DEPAKOTE) 500 MG DR tablet    buPROPion (WELLBUTRIN XL) 300 MG 24 hr tablet    Past Psychiatric History:  Viewed. History of several psychiatric admission from [phone] at [facility] and [name].  History of overdose on Risperdal.  History of psychosis, mania, severe depression.  Multiple intensive outpatient program.  Last psychiatric hospitalization was in 2006.  Tried Abilify, Seroquel, Strattera, lithium, Adderall, Celexa.  History of drinking and using drugs in the past.  Past Medical History:  Past Medical History:  Diagnosis Date  . GERD (gastroesophageal reflux disease)   . Hyperlipidemia     Past Surgical History:  Procedure Laterality Date  . KNEE SURGERY      Family Psychiatric History: Viewed.  Family History:  Family History  Problem Relation Age of Onset  . Depression Mother   . Alcohol abuse Brother   . Bipolar disorder Brother     Social History:  Social History   Socioeconomic History  . Marital status: Single    Spouse name: Not on file  . Number of children: Not on file  . Years of education: Not on file  . Highest education level: Not on file  Occupational History  . Not on file  Social Needs  . Financial resource strain: Not on file  . Food insecurity:    Worry: Not on file    Inability: Not on file  . Transportation needs:    Medical: Not on file    Non-medical: Not on file  Tobacco  Use  . Smoking status: Former Smoker    Packs/day: 0.50    Years: 25.00    Pack years: 12.50    Last attempt to quit: 04/26/2013    Years since quitting: 5.4  . Smokeless tobacco: Never Used  Substance and Sexual Activity  . Alcohol use: No    Alcohol/week: 0.0 standard drinks  . Drug use: No  . Sexual activity: Not Currently  Lifestyle  . Physical activity:    Days per week: Not on file    Minutes per session: Not on file  . Stress: Not on file  Relationships  . Social connections:    Talks on phone: Not on file    Gets together: Not on file    Attends religious service: Not on file    Active member of club or organization: Not on file    Attends  meetings of clubs or organizations: Not on file    Relationship status: Not on file  Other Topics Concern  . Not on file  Social History Narrative  . Not on file    Allergies: No Known Allergies  Metabolic Disorder Labs: Lab Results  Component Value Date   HGBA1C 5.4 02/17/2018   MPG 103 12/31/2016   MPG 120 (H) 01/12/2015   No results found for: PROLACTIN No results found for: CHOL, TRIG, HDL, CHOLHDL, VLDL, LDLCALC Lab Results  Component Value Date   TSH 0.68 12/31/2016    Therapeutic Level Labs: Lab Results  Component Value Date   LITHIUM 0.88 04/20/2007   Lab Results  Component Value Date   VALPROATE 40 (L) 02/17/2018   VALPROATE 44.0 (L) 12/31/2016   No components found for:  CBMZ  Current Medications: Current Outpatient Medications  Medication Sig Dispense Refill  . buPROPion (WELLBUTRIN XL) 300 MG 24 hr tablet TAKE 1 TABLET BY MOUTH EVERY DAY 90 tablet 0  . divalproex (DEPAKOTE) 500 MG DR tablet Take 2 tablets (1,000 mg total) by mouth at bedtime. 180 tablet 0  . OLANZapine (ZYPREXA) 10 MG tablet Take 1 tablet (10 mg total) by mouth at bedtime. Dose decrease 90 tablet 0  . omega-3 acid ethyl esters (LOVAZA) 1 G capsule Take 1 g by mouth 2 (two) times daily.    Marland Kitchen. omeprazole (PRILOSEC) 20 MG capsule Take 20 mg by mouth daily.    Marland Kitchen. terazosin (HYTRIN) 1 MG capsule Take 1 mg by mouth at bedtime.    . benztropine (COGENTIN) 1 MG tablet Take 1 tablet (1 mg total) by mouth at bedtime. (Patient not taking: Reported on 09/29/2018) 90 tablet 0   No current facility-administered medications for this visit.      Musculoskeletal: Strength & Muscle Tone: within normal limits Gait & Station: normal Patient leans: N/A  Psychiatric Specialty Exam: ROS  Blood pressure 126/72, height 5\' 11"  (1.803 m), weight 233 lb (105.7 kg).Body mass index is 32.5 kg/m.  General Appearance: Casual  Eye Contact:  Good  Speech:  Clear and Coherent  Volume:  Normal  Mood:  Anxious   Affect:  Appropriate  Thought Process:  Goal Directed  Orientation:  Full (Time, Place, and Person)  Thought Content: Logical   Suicidal Thoughts:  No  Homicidal Thoughts:  No  Memory:  Immediate;   Good Recent;   Good Remote;   Good  Judgement:  Good  Insight:  Present  Psychomotor Activity:  Normal  Concentration:  Concentration: Good and Attention Span: Good  Recall:  Good  Fund of Knowledge: Good  Language: Good  Akathisia:  No  Handed:  Right  AIMS (if indicated): not done  Assets:  Communication Skills Desire for Improvement Housing Resilience Social Support Transportation  ADL's:  Intact  Cognition: WNL  Sleep:  Good   Screenings:   Assessment and Plan: Bipolar disorder type I. Generalized anxiety disorder.  Doing better since Zyprexa dose is reduced and he is no longer taking Cogentin.  He does not feel that he need Cogentin even though he has mild tremors because Cogentin did not help these tremors.  He also started taking Depakote 500 mg twice a day instead of 1000 mg at bedtime.  He is not interested in therapy.  He used to see Thomas Woodward in the past.  Encourage to have sleep study as patient has sleep apnea but he has been not using CPAP.  Recommended to call us back if is any question or any concern.  Follow-up in 3 months.       Cleotis Nipper, MD 09/29/2018, 10:02 AM

## 2018-12-18 ENCOUNTER — Other Ambulatory Visit (HOSPITAL_COMMUNITY): Payer: Self-pay | Admitting: Psychiatry

## 2018-12-18 DIAGNOSIS — F319 Bipolar disorder, unspecified: Secondary | ICD-10-CM

## 2018-12-22 ENCOUNTER — Ambulatory Visit (INDEPENDENT_AMBULATORY_CARE_PROVIDER_SITE_OTHER): Payer: Medicare Other | Admitting: Psychiatry

## 2018-12-22 ENCOUNTER — Encounter (HOSPITAL_COMMUNITY): Payer: Self-pay | Admitting: Psychiatry

## 2018-12-22 DIAGNOSIS — F411 Generalized anxiety disorder: Secondary | ICD-10-CM

## 2018-12-22 DIAGNOSIS — F319 Bipolar disorder, unspecified: Secondary | ICD-10-CM | POA: Diagnosis not present

## 2018-12-22 MED ORDER — BENZTROPINE MESYLATE 1 MG PO TABS: ORAL_TABLET | ORAL | 0 refills | Status: DC

## 2018-12-22 MED ORDER — DIVALPROEX SODIUM 500 MG PO DR TAB: 500.0000 mg | DELAYED_RELEASE_TABLET | Freq: Two times a day (BID) | ORAL | 0 refills | Status: DC

## 2018-12-22 MED ORDER — OLANZAPINE 10 MG PO TABS: 10.0000 mg | ORAL_TABLET | Freq: Every day | ORAL | 0 refills | Status: DC

## 2018-12-22 MED ORDER — BUPROPION HCL ER (XL) 300 MG PO TB24: 300.0000 mg | ORAL_TABLET | Freq: Every day | ORAL | 0 refills | Status: DC

## 2018-12-22 NOTE — Progress Notes (Signed)
BH MD/PA/NP OP Progress Note  12/22/2018 10:40 AM Thomas Woodward  MRN:  [id number]  Chief Complaint: I am doing fine but sometimes I have tremors and sometimes I hear whispering.  I think the medicine working.  I am sleeping okay.  HPI: Lithium came for his appointment.  He is taking Zyprexa 10 mg which was reduced few months ago by covering psychiatrist as patient was complaining of lack of energy and excessive sedation.  Though his energy level is improved but sometime he noticed that he is whispering the voices.  He does not want to go up again because he feels he is sleeping better and he can handle these hallucinations.  He also noticed mild tremors in his hand and he admitted not taking the Cogentin.  He is more motivated and like to go back to his [employer] job which he had a stopped in the past.  He is hoping to get assignments soon.  He continues to work at [employer] and he likes his job.  He denies any mania, irritability, agitation, crying spells or any feeling of hopelessness or worthlessness.  He denies any suicidal thoughts.  He like to continue his medication.  Sometime he noticed lack of libido but this is a chronic issues and he promised to address this issue with his primary care physician.  He is in the process of changing his primary care physician as his primary care physician is on medical leave.  Patient denies drinking or using any illegal substances.  Visit Diagnosis:    ICD-10-CM   1. Bipolar 1 disorder, depressed (HCC) F31.9 OLANZapine (ZYPREXA) 10 MG tablet    divalproex (DEPAKOTE) 500 MG DR tablet    buPROPion (WELLBUTRIN XL) 300 MG 24 hr tablet    benztropine (COGENTIN) 1 MG tablet  2. GAD (generalized anxiety disorder) F41.1 buPROPion (WELLBUTRIN XL) 300 MG 24 hr tablet    Past Psychiatric History: Reviewed. H/O several psychiatric admission from 1994-1995 at [name].  h/O overdose on Risperdal.  H/O psychosis, mania, severe depression.   Multiple intensive outpatient program.  Last psychiatric hospitalization was in 2006.  Tried Abilify, Seroquel, Strattera, lithium, Adderall, Celexa.  H/O drinking and using drugs in the past.  Past Medical History:  Past Medical History:  Diagnosis Date  . GERD (gastroesophageal reflux disease)   . Hyperlipidemia     Past Surgical History:  Procedure Laterality Date  . KNEE SURGERY      Family Psychiatric History: Reviewed.  Family History:  Family History  Problem Relation Age of Onset  . Depression Mother   . Alcohol abuse Brother   . Bipolar disorder Brother     Social History:  Social History   Socioeconomic History  . Marital status: Single    Spouse name: Not on file  . Number of children: Not on file  . Years of education: Not on file  . Highest education level: Not on file  Occupational History  . Not on file  Social Needs  . Financial resource strain: Not on file  . Food insecurity:    Worry: Not on file    Inability: Not on file  . Transportation needs:    Medical: Not on file    Non-medical: Not on file  Tobacco Use  . Smoking status: Former Smoker    Packs/day: 0.50    Years: 25.00    Pack years: 12.50    Last attempt to quit: 04/26/2013    Years since  quitting: 5.6  . Smokeless tobacco: Never Used  Substance and Sexual Activity  . Alcohol use: No    Alcohol/week: 0.0 standard drinks  . Drug use: No  . Sexual activity: Not Currently  Lifestyle  . Physical activity:    Days per week: Not on file    Minutes per session: Not on file  . Stress: Not on file  Relationships  . Social connections:    Talks on phone: Not on file    Gets together: Not on file    Attends religious service: Not on file    Active member of club or organization: Not on file    Attends meetings of clubs or organizations: Not on file    Relationship status: Not on file  Other Topics Concern  . Not on file  Social History Narrative  . Not on file    Allergies: No  Known Allergies  Metabolic Disorder Labs: Lab Results  Component Value Date   HGBA1C 5.4 02/17/2018   MPG 103 12/31/2016   MPG 120 (H) 01/12/2015   No results found for: PROLACTIN No results found for: CHOL, TRIG, HDL, CHOLHDL, VLDL, LDLCALC Lab Results  Component Value Date   TSH 0.68 12/31/2016    Therapeutic Level Labs: Lab Results  Component Value Date   LITHIUM 0.88 04/20/2007   Lab Results  Component Value Date   VALPROATE 40 (L) 02/17/2018   VALPROATE 44.0 (L) 12/31/2016   No components found for:  CBMZ  Current Medications: Current Outpatient Medications  Medication Sig Dispense Refill  . buPROPion (WELLBUTRIN XL) 300 MG 24 hr tablet Take 1 tablet (300 mg total) by mouth daily. 90 tablet 0  . divalproex (DEPAKOTE) 500 MG DR tablet Take 1 tablet (500 mg total) by mouth 2 (two) times daily. 180 tablet 0  . OLANZapine (ZYPREXA) 10 MG tablet Take 1 tablet (10 mg total) by mouth at bedtime. 90 tablet 0  . omega-3 acid ethyl esters (LOVAZA) 1 G capsule Take 1 g by mouth 2 (two) times daily.    Marland Kitchen omeprazole (PRILOSEC) 20 MG capsule Take 20 mg by mouth daily.    Marland Kitchen terazosin (HYTRIN) 1 MG capsule Take 1 mg by mouth at bedtime.    . benztropine (COGENTIN) 1 MG tablet Take 1 tablet (1 mg total) by mouth at bedtime. (Patient not taking: Reported on 09/29/2018) 90 tablet 0   No current facility-administered medications for this visit.      Musculoskeletal: Strength & Muscle Tone: within normal limits Gait & Station: normal Patient leans: N/A  Psychiatric Specialty Exam: ROS  Blood pressure 128/71, pulse 82, height 5\' 11"  (1.803 m), weight 230 lb (104.3 kg).Body mass index is 32.08 kg/m.  General Appearance: Casual  Eye Contact:  Fair  Speech:  Slow  Volume:  Normal  Mood:  Euthymic  Affect:  Congruent  Thought Process:  Descriptions of Associations: Intact  Orientation:  Full (Time, Place, and Person)  Thought Content: Hallucinations: Auditory whispering    Suicidal Thoughts:  No  Homicidal Thoughts:  No  Memory:  Immediate;   Good Recent;   Good Remote;   Fair  Judgement:  Good  Insight:  Good  Psychomotor Activity:  Tremor and in both hands  Concentration:  Concentration: Fair and Attention Span: Fair  Recall:  Good  Fund of Knowledge: Good  Language: Good  Akathisia:  No  Handed:  Right  AIMS (if indicated): not done  Assets:  Communication Skills Desire for Improvement Housing Resilience  Social Support Transportation  ADL's:  Intact  Cognition: WNL  Sleep:  Good   Screenings:   Assessment and Plan: Generalized anxiety disorder.  Bipolar disorder, depressed.  Patient is a stable on his medication other than occasional auditory hallucination which he described as a whispering.  He does not want to go up on Zyprexa.  I recommended to restart Cogentin to help his tremors.  He also promised that he will contact his primary care physician to address his lack of libido and possible erectile dysfunction.  Encouraged to see [facility] for his therapy.  Patient has no other concerns from the medication.  I will continue Wellbutrin XL 300 mg daily, Zyprexa 10 mg at bedtime, Depakote 500 mg twice a day and Cogentin 1 mg half to 1 tablet at bedtime.  We will do blood work on his next appointment.  Discussed medication side effects and benefits.  Recommended to call us back if he has any question or any concern.  Follow-up in 3 months.   Cleotis NipperSyed T Kaari Zeigler, MD 12/22/2018, 10:40 AM

## 2019-02-24 DIAGNOSIS — E785 Hyperlipidemia, unspecified: Secondary | ICD-10-CM | POA: Diagnosis not present

## 2019-02-24 DIAGNOSIS — Z0000 Encounter for general adult medical examination without abnormal findings: Secondary | ICD-10-CM | POA: Diagnosis not present

## 2019-02-24 DIAGNOSIS — R739 Hyperglycemia, unspecified: Secondary | ICD-10-CM | POA: Diagnosis not present

## 2019-03-02 DIAGNOSIS — Z0000 Encounter for general adult medical examination without abnormal findings: Secondary | ICD-10-CM | POA: Diagnosis not present

## 2019-03-12 ENCOUNTER — Other Ambulatory Visit (HOSPITAL_COMMUNITY): Payer: Self-pay | Admitting: Psychiatry

## 2019-03-12 DIAGNOSIS — F319 Bipolar disorder, unspecified: Secondary | ICD-10-CM

## 2019-03-23 ENCOUNTER — Encounter (HOSPITAL_COMMUNITY): Payer: Self-pay | Admitting: Psychiatry

## 2019-03-23 ENCOUNTER — Other Ambulatory Visit: Payer: Self-pay

## 2019-03-23 ENCOUNTER — Ambulatory Visit (INDEPENDENT_AMBULATORY_CARE_PROVIDER_SITE_OTHER): Payer: Medicare Other | Admitting: Psychiatry

## 2019-03-23 ENCOUNTER — Other Ambulatory Visit (HOSPITAL_COMMUNITY): Payer: Self-pay | Admitting: Psychiatry

## 2019-03-23 DIAGNOSIS — F319 Bipolar disorder, unspecified: Secondary | ICD-10-CM | POA: Diagnosis not present

## 2019-03-23 DIAGNOSIS — F411 Generalized anxiety disorder: Secondary | ICD-10-CM | POA: Diagnosis not present

## 2019-03-23 MED ORDER — DIVALPROEX SODIUM 500 MG PO DR TAB: 500.0000 mg | DELAYED_RELEASE_TABLET | Freq: Two times a day (BID) | ORAL | 0 refills | Status: DC

## 2019-03-23 MED ORDER — OLANZAPINE 10 MG PO TABS: 10.0000 mg | ORAL_TABLET | Freq: Every day | ORAL | 0 refills | Status: DC

## 2019-03-23 MED ORDER — BUPROPION HCL ER (XL) 300 MG PO TB24: 300.0000 mg | ORAL_TABLET | Freq: Every day | ORAL | 0 refills | Status: DC

## 2019-03-23 NOTE — Progress Notes (Signed)
Virtual Visit via Telephone Note  I connected with [name] on 03/23/19 at 10:20 AM EDT by telephone and verified that I am speaking with the correct person using two identifiers.   I discussed the limitations, risks, security and privacy concerns of performing an evaluation and management service by telephone and the availability of in person appointments. I also discussed with the patient that there may be a patient responsible charge related to this service. The patient expressed understanding and agreed to proceed.   History of Present Illness: Patient was evaluated by phone session.  He is doing very well.  He just started a new relationship with a male and he is very happy about it.  However sometimes he gets very anxious and analyze himself if things are going well in his relationship.  He admitted some time he need to reassure himself.  He admitted some time racing thoughts but overall his sleep is good.  He had cut down his caffeine which helps him going to sleep.  Currently he is laid off due to COVID-19 but hoping to go back to work very soon.  We have recommended to try Cogentin to help his tremors but he stopped taking as he does not feel any improvement.  He believe these tremors are residual due to lithium which he is no longer taking it.  He denies any paranoia, hallucination.  He is taking his medication as prescribed.  He had a blood work on April 20 at his primary care physician.  Patient told his Depakote level was 50.  Patient denies any suicidal thoughts or homicidal thought.  He like to continue his current medication.   Past Psychiatric History: Reviewed. H/O [facility] 1994[facility]. h/O overdose on Risperdal. H/O psychosis, mania, severe depression. Multiple intensive outpatient program. Last psychiatric hospitalization was in 2006. Tried Abilify, Seroquel, Strattera, lithium, Adderall, Celexa. H/O drinking  and using drugs in the past.   Observations/Objective: Mental status attention done on the phone.  Patient describes his mood euthymic.  His thought process slow but logical.  His speech is also slow with decreased volume and tone but coherent.  He denies current auditory or visual hallucination.  He denies any active or passive suicidal thoughts or homicidal thought.  There were no grandiosity or delusions.  He admitted mild tremors in his right hand but does not interfere in his daily activities.  He is alert and oriented x3.  His attention and concentration is fair.  His fund of knowledge is average.  His cognition is intact.  His insight and judgment is okay.  Assessment and Plan: Bipolar disorder, depressed type.  Generalized anxiety disorder.  Reassurance given as patient started a new relationship.  Discontinue Cogentin as patient is no longer taking it.  He is comfortable with his mild tremors as it does not interfere in his daily routine.  His Depakote level was 50 which was done at his primary care physician on February 22, 2019.  I recommend to have his primary care physician fax all the results.  I discussed medication side effects and benefits.  Continue Wellbutrin XL 300 mg daily, Zyprexa 10 mg at bedtime and Depakote 500 mg twice a day.  Recommended to call us back if is any question or any concern.  Follow-up in 3 months.  Follow Up Instructions:    I discussed the assessment and treatment plan with the patient. The patient was provided an opportunity to ask questions and all  were answered. The patient agreed with the plan and demonstrated an understanding of the instructions.   The patient was advised to call back or seek an in-person evaluation if the symptoms worsen or if the condition fails to improve as anticipated.  I provided 20 minutes of non-face-to-face time during this encounter.   Kathlee Nations, MD

## 2019-06-19 ENCOUNTER — Other Ambulatory Visit (HOSPITAL_COMMUNITY): Payer: Self-pay | Admitting: Psychiatry

## 2019-06-19 DIAGNOSIS — F319 Bipolar disorder, unspecified: Secondary | ICD-10-CM

## 2019-06-23 ENCOUNTER — Other Ambulatory Visit: Payer: Self-pay

## 2019-06-23 ENCOUNTER — Ambulatory Visit (HOSPITAL_COMMUNITY): Payer: Medicare Other | Admitting: Psychiatry

## 2019-06-23 ENCOUNTER — Encounter (HOSPITAL_COMMUNITY): Payer: Self-pay | Admitting: Psychiatry

## 2019-06-23 ENCOUNTER — Ambulatory Visit (INDEPENDENT_AMBULATORY_CARE_PROVIDER_SITE_OTHER): Payer: Medicare Other | Admitting: Psychiatry

## 2019-06-23 DIAGNOSIS — F319 Bipolar disorder, unspecified: Secondary | ICD-10-CM

## 2019-06-23 DIAGNOSIS — F411 Generalized anxiety disorder: Secondary | ICD-10-CM | POA: Diagnosis not present

## 2019-06-23 MED ORDER — BUPROPION HCL ER (XL) 300 MG PO TB24: 300.0000 mg | ORAL_TABLET | Freq: Every day | ORAL | 0 refills | Status: DC

## 2019-06-23 MED ORDER — DIVALPROEX SODIUM 500 MG PO DR TAB: 500.0000 mg | DELAYED_RELEASE_TABLET | Freq: Two times a day (BID) | ORAL | 0 refills | Status: DC

## 2019-06-23 MED ORDER — OLANZAPINE 10 MG PO TABS: 10.0000 mg | ORAL_TABLET | Freq: Every day | ORAL | 0 refills | Status: DC

## 2019-06-23 NOTE — Progress Notes (Signed)
Virtual Visit via Telephone Note  I connected with Thomas Woodward on 06/23/19 at  3:20 PM EDT by telephone and verified that I am speaking with the correct person using two identifiers.   I discussed the limitations, risks, security and privacy concerns of performing an evaluation and management service by telephone and the availability of in person appointments. I also discussed with the patient that there may be a patient responsible charge related to this service. The patient expressed understanding and agreed to proceed.   History of Present Illness: Patient was evaluated by phone session.  He is taking his medication and reported no side effects.  He has mild tremors but does not want to go back on Cogentin because that did not help his tremors.  He is sleeping good.  He is not working at [employer] but is getting financial help and support from there.  Usually go there and try to help as much as possible.  He is not agitated denies any recent anger, mania, psychosis, hallucination or any suicidal thoughts.  He like to keep his current medication since it is working well.  He denies drinking or using any illegal substances.  He is getting along with his family member.  His energy level is good.  His appetite is okay.  He reported his weight is stable.  Past Psychiatric History:Reviewed. H/O[facility].h/Ooverdose on Risperdal.H/Opsychosis, mania, severe depression. Multiple intensive outpatient program. Last psychiatric hospitalization was in 2006. Tried Abilify, Seroquel, Strattera, lithium, Adderall, Celexa. H/Odrinking and using drugs in the past.   Psychiatric Specialty Exam: Physical Exam  ROS  There were no vitals taken for this visit.There is no height or weight on file to calculate BMI.  General Appearance: NA  Eye Contact:  NA  Speech:  Clear and Coherent and Slow  Volume:  Decreased  Mood:   Euthymic  Affect:  NA  Thought Process:  Goal Directed  Orientation:  Full (Time, Place, and Person)  Thought Content:  WDL  Suicidal Thoughts:  No  Homicidal Thoughts:  No  Memory:  Immediate;   Good Recent;   Good Remote;   Good  Judgement:  Good  Insight:  Good  Psychomotor Activity:  NA  Concentration:  Concentration: Fair and Attention Span: Fair  Recall:  Good  Fund of Knowledge:  Good  Language:  Good  Akathisia:  No  Handed:  Right  AIMS (if indicated):   tremors  Assets:  Communication Skills Desire for Improvement Housing Resilience Social Support  ADL's:  Intact  Cognition:  WNL  Sleep:   ok      Assessment and Plan: Bipolar disorder type I versus depressed type.  Generalized anxiety disorder.  Patient is comfortable on his current medication.  He does not want to go back on Cogentin as his mild tremors did not help and it does not interfere with his daily routine.  His last Depakote level was 50 which was done in April.  Discussed medication side effects and benefits.  Continue Wellbutrin XL 300 mg daily, Zyprexa 10 mg at bedtime and Depakote 5 mg twice a day.  Recommended to call us back if is any question or any concern.  Follow-up in 3 months.  Follow Up Instructions:    I discussed the assessment and treatment plan with the patient. The patient was provided an opportunity to ask questions and all were answered. The patient agreed with the plan and demonstrated an understanding of the  instructions.   The patient was advised to call back or seek an in-person evaluation if the symptoms worsen or if the condition fails to improve as anticipated.  I provided 20 minutes of non-face-to-face time during this encounter.   Kathlee Nations, MD

## 2019-08-25 DIAGNOSIS — I10 Essential (primary) hypertension: Secondary | ICD-10-CM | POA: Diagnosis not present

## 2019-08-25 DIAGNOSIS — E785 Hyperlipidemia, unspecified: Secondary | ICD-10-CM | POA: Diagnosis not present

## 2019-08-25 DIAGNOSIS — R739 Hyperglycemia, unspecified: Secondary | ICD-10-CM | POA: Diagnosis not present

## 2019-09-01 DIAGNOSIS — Z0000 Encounter for general adult medical examination without abnormal findings: Secondary | ICD-10-CM | POA: Diagnosis not present

## 2019-09-01 DIAGNOSIS — R739 Hyperglycemia, unspecified: Secondary | ICD-10-CM | POA: Diagnosis not present

## 2019-09-01 DIAGNOSIS — I10 Essential (primary) hypertension: Secondary | ICD-10-CM | POA: Diagnosis not present

## 2019-09-01 DIAGNOSIS — E785 Hyperlipidemia, unspecified: Secondary | ICD-10-CM | POA: Diagnosis not present

## 2019-09-21 ENCOUNTER — Ambulatory Visit (INDEPENDENT_AMBULATORY_CARE_PROVIDER_SITE_OTHER): Payer: Medicare Other | Admitting: Psychiatry

## 2019-09-21 ENCOUNTER — Other Ambulatory Visit: Payer: Self-pay

## 2019-09-21 ENCOUNTER — Encounter (HOSPITAL_COMMUNITY): Payer: Self-pay | Admitting: Psychiatry

## 2019-09-21 DIAGNOSIS — F319 Bipolar disorder, unspecified: Secondary | ICD-10-CM

## 2019-09-21 DIAGNOSIS — F411 Generalized anxiety disorder: Secondary | ICD-10-CM

## 2019-09-21 MED ORDER — OLANZAPINE 5 MG PO TABS: 5.0000 mg | ORAL_TABLET | Freq: Every day | ORAL | 0 refills | Status: DC

## 2019-09-21 MED ORDER — DIVALPROEX SODIUM 500 MG PO DR TAB: 500.0000 mg | DELAYED_RELEASE_TABLET | Freq: Two times a day (BID) | ORAL | 0 refills | Status: AC

## 2019-09-21 MED ORDER — BUPROPION HCL ER (XL) 300 MG PO TB24: 300.0000 mg | ORAL_TABLET | Freq: Every day | ORAL | 0 refills | Status: DC

## 2019-09-21 NOTE — Progress Notes (Signed)
Virtual Visit via Telephone Note  I connected with Thomas Woodward on 09/21/19 at  2:00 PM EST by telephone and verified that I am speaking with the correct person using two identifiers.   I discussed the limitations, risks, security and privacy concerns of performing an evaluation and management service by telephone and the availability of in person appointments. I also discussed with the patient that there may be a patient responsible charge related to this service. The patient expressed understanding and agreed to proceed.   History of Present Illness: Patient was evaluated by phone session.  Is compliant with medication and is doing actually very well.  He is working part-time but hoping to start full-time starting next year.  Patient is happy once he received that offer.  He has tremors and he like to cut down the medication since he is doing well.  Patient works at [employer].  He denies any mania, psychosis, hallucination, irritability or any anger.  He sleeps good.  Sometimes he takes over-the-counter melatonin.  He is getting along with his family member very well.  Denies drinking or using any illegal substances.  His appetite is okay.  His weight is unchanged from the past.  Past Psychiatric History:Reviewed. H/O[facility] psychiatric [facility] from 1994-[facility].H/Ooverdose on Risperdal.H/Opsychosis, mania, severe depression. Multiple intensive outpatient program. Last psychiatric hospitalization was in 2006. Tried Abilify, Seroquel, Strattera, lithium, Adderall, Celexa. H/Odrinking and using drugs in the past.    Psychiatric Specialty Exam: Physical Exam  ROS  There were no vitals taken for this visit.There is no height or weight on file to calculate BMI.  General Appearance: NA  Eye Contact:  NA  Speech:  Clear and Coherent and Slow  Volume:  Decreased  Mood:  Euthymic  Affect:  NA  Thought Process:  Goal Directed  Orientation:  Full  (Time, Place, and Person)  Thought Content:  WDL and Logical  Suicidal Thoughts:  No  Homicidal Thoughts:  No  Memory:  Immediate;   Good Recent;   Good Remote;   Good  Judgement:  Good  Insight:  Good  Psychomotor Activity:  NA  Concentration:  Concentration: Fair and Attention Span: Fair  Recall:  Good  Fund of Knowledge:  Good  Language:  Good  Akathisia:  tremors  Handed:  Right  AIMS (if indicated):     Assets:  Communication Skills Desire for Improvement Resilience Social Support  ADL's:  Intact  Cognition:  WNL  Sleep:   good      Assessment and Plan: Bipolar disorder, depressed type.  Generalized anxiety disorder.  Discussed reducing his medication Zyprexa from 10 mg to 5 mg.  He is hoping it will help his tremors.  He does not want to try Cogentin since it did not help.  He is also not interested in Artane.  He used to take 20 mg Zyprexa which we reduce and so far he is stable.  I recommend if he noticed worsening of symptoms and he will call us back.  Continue Wellbutrin XL 300 mg daily, Depakote 500 mg twice a day and new dose of Zyprexa 5 mg at bedtime.  Recommended to call us back if is any question or any concern.  Follow-up in 3 months.  Follow Up Instructions:    I discussed the assessment and treatment plan with the patient. The patient was provided an opportunity to ask questions and all were answered. The patient agreed with the plan and demonstrated an understanding of  the instructions.   The patient was advised to call back or seek an in-person evaluation if the symptoms worsen or if the condition fails to improve as anticipated.  I provided 20 minutes of non-face-to-face time during this encounter.   Kathlee Nations, MD

## 2019-09-23 ENCOUNTER — Ambulatory Visit (HOSPITAL_COMMUNITY): Payer: Medicare Other | Admitting: Psychiatry

## 2019-10-06 ENCOUNTER — Encounter (HOSPITAL_COMMUNITY): Payer: Self-pay | Admitting: Emergency Medicine

## 2019-10-06 ENCOUNTER — Emergency Department (HOSPITAL_COMMUNITY)
Admission: EM | Admit: 2019-10-06 | Discharge: 2019-10-06 | Disposition: A | Discharge status: Home / Self Care | Payer: Medicare Other | Attending: Emergency Medicine | Admitting: Emergency Medicine

## 2019-10-06 ENCOUNTER — Emergency Department (HOSPITAL_COMMUNITY): Payer: Medicare Other

## 2019-10-06 ENCOUNTER — Other Ambulatory Visit: Payer: Self-pay

## 2019-10-06 DIAGNOSIS — R0789 Other chest pain: Secondary | ICD-10-CM | POA: Diagnosis not present

## 2019-10-06 DIAGNOSIS — Z79899 Other long term (current) drug therapy: Secondary | ICD-10-CM | POA: Diagnosis not present

## 2019-10-06 DIAGNOSIS — Z87891 Personal history of nicotine dependence: Secondary | ICD-10-CM | POA: Insufficient documentation

## 2019-10-06 DIAGNOSIS — R079 Chest pain, unspecified: Secondary | ICD-10-CM | POA: Diagnosis not present

## 2019-10-06 HISTORY — DX: Bipolar disorder, unspecified: F31.9

## 2019-10-06 LAB — CBC
HCT: 44.8 % (ref 39.0–52.0)
Hemoglobin: 14.9 g/dL (ref 13.0–17.0)
MCH: 30.3 pg (ref 26.0–34.0)
MCHC: 33.3 g/dL (ref 30.0–36.0)
MCV: 91.2 fL (ref 80.0–100.0)
Platelets: 282 10*3/uL (ref 150–400)
RBC: 4.91 MIL/uL (ref 4.22–5.81)
RDW: 12.4 % (ref 11.5–15.5)
WBC: 6.3 10*3/uL (ref 4.0–10.5)
nRBC: 0 % (ref 0.0–0.2)

## 2019-10-06 LAB — BASIC METABOLIC PANEL
Anion gap: 11 (ref 5–15)
BUN: 14 mg/dL (ref 6–20)
CO2: 25 mmol/L (ref 22–32)
Calcium: 9.6 mg/dL (ref 8.9–10.3)
Chloride: 101 mmol/L (ref 98–111)
Creatinine, Ser: 1.3 mg/dL — ABNORMAL HIGH (ref 0.61–1.24)
GFR calc Af Amer: >60 mL/min (ref >60)
GFR calc non Af Amer: >60 mL/min (ref >60)
Glucose, Bld: 103 mg/dL — ABNORMAL HIGH (ref 70–99)
Potassium: 4.3 mmol/L (ref 3.5–5.1)
Sodium: 137 mmol/L (ref 135–145)

## 2019-10-06 LAB — TROPONIN I (HIGH SENSITIVITY): Troponin I (High Sensitivity): 3 ng/L (ref <18)

## 2019-10-06 MED ORDER — SODIUM CHLORIDE 0.9% FLUSH: 3.0000 mL | Freq: Once | INTRAVENOUS | Status: DC

## 2019-10-06 NOTE — ED Provider Notes (Signed)
[facility]ERGENCY DEPARTMENT Provider Note   CSN: [id number] Arrival date & time: 10/06/19  1424     History   Chief Complaint Chief Complaint  Patient presents with  . Chest Pain    HPI Thomas Woodward is a 43 y.o. male with hx of bipolar d/o and GERD who presents with chest pain. He states that the pain started yesterday after he was crying. The pain is substernal and does not radiate. It is 5/10 in severity and feels like a pressure. Movement makes it worse. Nothing makes it better. It is intermittent and will last several minutes and then go away. He has never had this pain before. He has had some pain going down his left arm intermittently for several months and he's seen his PCP for this and was given reassurance. He denies fever, chills, diaphoresis, lightheadedness/syncope, SOB, cough, abdominal pain, nausea, leg swelling. He is very worried about this pain and called his PCP's office and the nurse there told him to come to the ED.   HPI  Past Medical History:  Diagnosis Date  . Bipolar 1 disorder ([place])   . GERD (gastroesophageal reflux disease)   . Hyperlipidemia     Patient Active Problem List   Diagnosis Date Noted  . OSA (obstructive sleep apnea) 07/01/2018  . Bipolar disorder, unspecified ([place]) 03/11/2012    Past Surgical History:  Procedure Laterality Date  . KNEE SURGERY          Home Medications    Prior to Admission medications   Medication Sig Start Date End Date Taking? Authorizing Provider  buPROPion (WELLBUTRIN XL) 300 MG 24 hr tablet Take 1 tablet (300 mg total) by mouth daily. 09/21/19  Yes Arfeen, Arlyce Harman, MD  divalproex (DEPAKOTE) 500 MG DR tablet Take 1 tablet (500 mg total) by mouth 2 (two) times daily. 09/21/19  Yes Arfeen, Arlyce Harman, MD  Multiple Vitamin (MULTIVITAMIN WITH MINERALS) TABS tablet Take 1 tablet by mouth daily.   Yes [provider]  OLANZapine (ZYPREXA) 5 MG tablet Take 1 tablet (5 mg total) by  mouth at bedtime. 09/21/19  Yes Arfeen, Arlyce Harman, MD  omega-3 acid ethyl esters (LOVAZA) 1 G capsule Take 1 g by mouth 2 (two) times daily.   Yes [provider]  omeprazole (PRILOSEC) 20 MG capsule Take 20 mg by mouth daily.   Yes [provider]  sildenafil (REVATIO) 20 MG tablet Take 20 mg by mouth daily as needed (ED).   Yes [provider]  vitamin C (ASCORBIC ACID) 500 MG tablet Take 1,000 mg by mouth daily.   Yes [provider]    Family History Family History  Problem Relation Age of Onset  . Depression Mother   . Alcohol abuse Brother   . Bipolar disorder Brother     Social History Social History   Tobacco Use  . Smoking status: Former Smoker    Packs/day: 0.50    Years: 25.00    Pack years: 12.50    Quit date: 04/26/2013    Years since quitting: 6.4  . Smokeless tobacco: Never Used  Substance Use Topics  . Alcohol use: No    Alcohol/week: 0.0 standard drinks  . Drug use: No     Allergies   Nsaids   Review of Systems Review of Systems  Constitutional: Negative for chills and fever.  Respiratory: Negative for cough and shortness of breath.   Cardiovascular: Positive for chest pain. Negative for leg swelling.  Gastrointestinal: Negative for abdominal pain, nausea and vomiting.  Neurological: Negative for syncope and light-headedness.  All other systems reviewed and are negative.    Physical Exam Updated Vital Signs BP (!) 150/97 (BP Location: Right Arm)   Pulse 71   Temp 99.1 F (37.3 C) (Oral)   Resp (!) 9   SpO2 100%   Physical Exam Vitals signs and nursing note reviewed.  Constitutional:      General: He is not in acute distress.    Appearance: Normal appearance. He is well-developed. He is not ill-appearing.     Comments: Alert, calm, cooperative.  HENT:     Head: Normocephalic and atraumatic.  Eyes:     General: No scleral icterus.       Right eye: No discharge.        Left eye: No discharge.      Conjunctiva/sclera: Conjunctivae normal.     Pupils: Pupils are equal, round, and reactive to light.     Comments: Dysconjugate gaze  Neck:     Musculoskeletal: Normal range of motion.  Cardiovascular:     Rate and Rhythm: Normal rate and regular rhythm.  Pulmonary:     Effort: Pulmonary effort is normal. No respiratory distress.     Breath sounds: Normal breath sounds.  Chest:     Chest wall: Tenderness (mild sternal chest tenderness) present.  Abdominal:     General: There is no distension.     Palpations: Abdomen is soft.     Tenderness: There is no abdominal tenderness.  Musculoskeletal:     Right lower leg: No edema.     Left lower leg: No edema.  Skin:    General: Skin is warm and dry.  Neurological:     Mental Status: He is alert and oriented to person, place, and time.  Psychiatric:        Mood and Affect: Mood normal.        Behavior: Behavior normal.      ED Treatments / Results  Labs (all labs ordered are listed, but only abnormal results are displayed) Labs Reviewed  BASIC METABOLIC PANEL - Abnormal; Notable for the following components:      Result Value   Glucose, Bld 103 (*)    Creatinine, Ser 1.30 (*)    All other components within normal limits  CBC  TROPONIN I (HIGH SENSITIVITY)    EKG None  Radiology Dg Chest 2 View  Result Date: 10/06/2019 CLINICAL DATA:  43 year old male with chest pain. EXAM: CHEST - 2 VIEW COMPARISON:  Chest radiograph dated 02/19/2007. FINDINGS: The heart size and mediastinal contours are within normal limits. Both lungs are clear. The visualized skeletal structures are unremarkable. IMPRESSION: No active cardiopulmonary disease. Electronically Signed   By: Elgie CollardArash  Radparvar M.D.   On: 10/06/2019 15:32    Procedures Procedures (including critical care time)  Medications Ordered in ED Medications  sodium chloride flush (NS) 0.9 % injection 3 mL (has no administration in time range)     Initial Impression / Assessment  and Plan / ED Course  I have reviewed the triage vital signs and the nursing notes.  Pertinent labs & imaging results that were available during my care of the patient were reviewed by me and considered in my medical decision making (see chart for details).  43 year old male presents with substernal chest pressure. Pain is atypical sounding. Chest pain work up is reassuring. Doubt ACS, PE, pericarditis, esophageal rupture, tension pneumothorax, aortic dissection, cardiac  tamponade. EKG is NSR. CXR is negative. Troponin is 3. Labs are remarkable for mildly elevated Cr which is at baseline with preserved GFR. No significant past or family hx of cardiac disease. Patient is non-smoker. HEART score is 1. PERC negative. Pain is likely MSK vs anxiety related. Advised f/u with PCP.   Final Clinical Impressions(s) / ED Diagnoses   Final diagnoses:  Atypical chest pain    ED Discharge Orders    None       Bethel Born, PA-C 10/06/19 1729    Mancel Bale, MD 10/08/19 1159

## 2019-10-06 NOTE — ED Triage Notes (Signed)
Pt reports middle and left sided CP yesterday while at rest. States today began having the pain again while driving. Described as sore, pressure, denied radiation of pain, nausea, vomiting, weakness. VSS, nad at present.

## 2019-10-06 NOTE — ED Notes (Signed)
Pt verbalized understanding of DC instructions and follow up care.  

## 2019-10-06 NOTE — Discharge Instructions (Signed)
Take Tylenol or Ibuprofen for pain  Please follow up with Dr. Maudie Mercury Return if worsening

## 2019-10-06 NOTE — ED Notes (Signed)
Pt ambulated self efficiently to restroom with no difficulty. Pt returned safely to bedside. 

## 2019-12-21 ENCOUNTER — Ambulatory Visit (HOSPITAL_COMMUNITY): Payer: Medicare Other | Admitting: Psychiatry

## 2020-04-12 ENCOUNTER — Observation Stay (HOSPITAL_COMMUNITY)
Admission: AD | Admit: 2020-04-12 | Discharge: 2020-04-13 | Disposition: A | Discharge status: Home / Self Care | Payer: 59 | Attending: Emergency Medicine | Admitting: Emergency Medicine

## 2020-04-12 DIAGNOSIS — G4733 Obstructive sleep apnea (adult) (pediatric): Secondary | ICD-10-CM | POA: Insufficient documentation

## 2020-04-12 DIAGNOSIS — Z87891 Personal history of nicotine dependence: Secondary | ICD-10-CM | POA: Insufficient documentation

## 2020-04-12 DIAGNOSIS — K219 Gastro-esophageal reflux disease without esophagitis: Secondary | ICD-10-CM | POA: Insufficient documentation

## 2020-04-12 DIAGNOSIS — F22 Delusional disorders: Secondary | ICD-10-CM | POA: Diagnosis not present

## 2020-04-12 DIAGNOSIS — R05 Cough: Secondary | ICD-10-CM | POA: Insufficient documentation

## 2020-04-12 DIAGNOSIS — E785 Hyperlipidemia, unspecified: Secondary | ICD-10-CM | POA: Insufficient documentation

## 2020-04-12 DIAGNOSIS — F319 Bipolar disorder, unspecified: Secondary | ICD-10-CM | POA: Diagnosis not present

## 2020-04-12 DIAGNOSIS — Z79899 Other long term (current) drug therapy: Secondary | ICD-10-CM | POA: Diagnosis not present

## 2020-04-12 DIAGNOSIS — Z20822 Contact with and (suspected) exposure to covid-19: Secondary | ICD-10-CM | POA: Diagnosis not present

## 2020-04-12 DIAGNOSIS — U071 COVID-19: Secondary | ICD-10-CM | POA: Diagnosis not present

## 2020-04-12 DIAGNOSIS — R059 Cough, unspecified: Secondary | ICD-10-CM

## 2020-04-12 LAB — CBC WITH DIFFERENTIAL/PLATELET
Abs Immature Granulocytes: 0.01 10*3/uL (ref 0.00–0.07)
Basophils Absolute: 0 10*3/uL (ref 0.0–0.1)
Basophils Relative: 1 %
Eosinophils Absolute: 0.1 10*3/uL (ref 0.0–0.5)
Eosinophils Relative: 3 %
HCT: 43.5 % (ref 39.0–52.0)
Hemoglobin: 14.2 g/dL (ref 13.0–17.0)
Immature Granulocytes: 0 %
Lymphocytes Relative: 26 %
Lymphs Abs: 1.5 10*3/uL (ref 0.7–4.0)
MCH: 30.7 pg (ref 26.0–34.0)
MCHC: 32.6 g/dL (ref 30.0–36.0)
MCV: 94.2 fL (ref 80.0–100.0)
Monocytes Absolute: 0.4 10*3/uL (ref 0.1–1.0)
Monocytes Relative: 7 %
Neutro Abs: 3.6 10*3/uL (ref 1.7–7.7)
Neutrophils Relative %: 63 %
Platelets: 308 10*3/uL (ref 150–400)
RBC: 4.62 MIL/uL (ref 4.22–5.81)
RDW: 12.9 % (ref 11.5–15.5)
WBC: 5.7 10*3/uL (ref 4.0–10.5)
nRBC: 0 % (ref 0.0–0.2)

## 2020-04-12 LAB — COMPREHENSIVE METABOLIC PANEL
ALT: 38 U/L (ref 0–44)
AST: 52 U/L — ABNORMAL HIGH (ref 15–41)
Albumin: 3.9 g/dL (ref 3.5–5.0)
Alkaline Phosphatase: 59 U/L (ref 38–126)
Anion gap: 10 (ref 5–15)
BUN: 15 mg/dL (ref 6–20)
CO2: 29 mmol/L (ref 22–32)
Calcium: 9.5 mg/dL (ref 8.9–10.3)
Chloride: 109 mmol/L (ref 98–111)
Creatinine, Ser: 1.23 mg/dL (ref 0.61–1.24)
GFR calc Af Amer: >60 mL/min (ref >60)
GFR calc non Af Amer: >60 mL/min (ref >60)
Glucose, Bld: 118 mg/dL — ABNORMAL HIGH (ref 70–99)
Potassium: 4.8 mmol/L (ref 3.5–5.1)
Sodium: 148 mmol/L — ABNORMAL HIGH (ref 135–145)
Total Bilirubin: 0.8 mg/dL (ref 0.3–1.2)
Total Protein: 6.5 g/dL (ref 6.5–8.1)

## 2020-04-12 LAB — HEMOGLOBIN A1C
Hgb A1c MFr Bld: 5.7 % — ABNORMAL HIGH (ref 4.8–5.6)
Mean Plasma Glucose: 116.89 mg/dL

## 2020-04-12 LAB — SARS CORONAVIRUS 2 BY RT PCR (HOSPITAL ORDER, PERFORMED IN ~~LOC~~ HOSPITAL LAB): SARS Coronavirus 2: POSITIVE — AB

## 2020-04-12 LAB — ETHANOL: Alcohol, Ethyl (B): <10 mg/dL (ref <10)

## 2020-04-12 LAB — TSH: TSH: 1.07 u[IU]/mL (ref 0.350–4.500)

## 2020-04-12 LAB — VALPROIC ACID LEVEL: Valproic Acid Lvl: 43 ug/mL — ABNORMAL LOW (ref 50.0–100.0)

## 2020-04-12 MED ORDER — ZIPRASIDONE MESYLATE 20 MG IM SOLR: 20.0000 mg | INTRAMUSCULAR | Status: DC | PRN

## 2020-04-12 MED ORDER — HYDROXYZINE HCL 25 MG PO TABS: 25.0000 mg | ORAL_TABLET | Freq: Three times a day (TID) | ORAL | Status: DC | PRN

## 2020-04-12 MED ORDER — ACETAMINOPHEN 325 MG PO TABS: 650.0000 mg | ORAL_TABLET | Freq: Four times a day (QID) | ORAL | Status: DC | PRN

## 2020-04-12 MED ORDER — PANTOPRAZOLE SODIUM 20 MG PO TBEC
20.0000 mg | DELAYED_RELEASE_TABLET | Freq: Every day | ORAL | Status: DC
  Administered 2020-04-13: 20 mg via ORAL
  Filled 2020-04-12: qty 1

## 2020-04-12 MED ORDER — DIVALPROEX SODIUM 500 MG PO DR TAB
500.0000 mg | DELAYED_RELEASE_TABLET | Freq: Two times a day (BID) | ORAL | Status: DC
  Administered 2020-04-12 – 2020-04-13 (×2): 500 mg via ORAL
  Filled 2020-04-12 (×3): qty 1

## 2020-04-12 MED ORDER — LORAZEPAM 1 MG PO TABS
1.0000 mg | ORAL_TABLET | Freq: Four times a day (QID) | ORAL | Status: DC | PRN
  Administered 2020-04-12: 1 mg via ORAL
  Filled 2020-04-12: qty 1

## 2020-04-12 MED ORDER — MAGNESIUM HYDROXIDE 400 MG/5ML PO SUSP: 30.0000 mL | Freq: Every day | ORAL | Status: DC | PRN

## 2020-04-12 MED ORDER — OMEGA-3-ACID ETHYL ESTERS 1 G PO CAPS
1.0000 g | ORAL_CAPSULE | Freq: Two times a day (BID) | ORAL | Status: DC
  Administered 2020-04-12 – 2020-04-13 (×2): 1 g via ORAL
  Filled 2020-04-12 (×3): qty 1

## 2020-04-12 MED ORDER — OLANZAPINE 5 MG PO TBDP: 5.0000 mg | ORAL_TABLET | Freq: Three times a day (TID) | ORAL | Status: DC | PRN

## 2020-04-12 MED ORDER — ADULT MULTIVITAMIN W/MINERALS CH
1.0000 | ORAL_TABLET | Freq: Every day | ORAL | Status: DC
  Administered 2020-04-13: 1 via ORAL
  Filled 2020-04-12 (×2): qty 1

## 2020-04-12 MED ORDER — PANTOPRAZOLE SODIUM 40 MG PO TBEC
40.0000 mg | DELAYED_RELEASE_TABLET | Freq: Every day | ORAL | Status: DC
  Filled 2020-04-12: qty 1

## 2020-04-12 MED ORDER — ALUM & MAG HYDROXIDE-SIMETH 200-200-20 MG/5ML PO SUSP: 30.0000 mL | ORAL | Status: DC | PRN

## 2020-04-12 MED ORDER — OLANZAPINE 10 MG PO TABS
20.0000 mg | ORAL_TABLET | Freq: Every day | ORAL | Status: DC
  Administered 2020-04-12: 20 mg via ORAL
  Filled 2020-04-12: qty 2

## 2020-04-12 MED ORDER — LORAZEPAM 2 MG/ML IJ SOLN: 1.0000 mg | Freq: Four times a day (QID) | INTRAMUSCULAR | Status: DC | PRN

## 2020-04-12 MED ORDER — TRAZODONE HCL 100 MG PO TABS: 50.0000 mg | ORAL_TABLET | Freq: Every evening | ORAL | Status: DC | PRN
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
## 2020-04-12 NOTE — Progress Notes (Signed)
Patient ID: Thomas Woodward, male   DOB: [birth year], 44 y.o.   MRN: [id number]                             [facility] Observation Crisis Plan  Reason for Crisis Plan:  Chronic Mental Illness/Medical Illness, Crisis Stabilization and Medication Management   Plan of Care:  Referral for Inpatient Hospitalization and Referral for Telepsychiatry/Psychiatric Consult  Family Support:      Current Living Environment:  Living Arrangements: Spouse/significant other  Insurance:   Hospital Account    Name Acct ID Class Status Primary Coverage   [place], [place] [id number] BEHAVIORAL HEALTH OBSERVATION Open [employer] MEDICARE - UHC MEDICARE        Guarantor Account (for Hospital Account [id number])    Name Relation to Pt Service Area Active? Acct Type   Cathren Harsh D Self [facility] Yes [facility]   Address Phone       [address] [place], [place] [address] [phone](H)          Coverage Information (for Hospital Account [id number])    F/O Payor/Plan Precert #   [facility] MEDICARE/UHC MEDICARE    Subscriber Subscriber #   [place], [place] [id number]   Address Phone   [address] [place], [place] [address] [phone]      Legal Guardian:  Legal Guardian: Other:(Self)  Primary Care Provider:  Patient, No Pcp Per  Current Outpatient Providers:  unknown  Psychiatrist:  Name of Psychiatrist: Unclear if he has begun to see Dr. Lolly Mustache again?  Counselor/Therapist:  Name of Therapist: None  Compliant with Medications:  Yes  Additional Information:   Tania Ade 6/9/202112:26 PM

## 2020-04-12 NOTE — H&P (Signed)
Behavioral Health Medical Screening Exam  Thomas Woodward is an 44 y.o. male. He is seen by Dr. Lolly Mustache for bipolar disorder. He is presenting for increased agitation and "rage episodes." He became agitated and was posturing towards staff during assessment. He is paranoid that people at work are following him. He is agreeable to inpatient admission. He reports compliance with Depakote 500 mg BID and Zyprexa 20 mg daily for about one month.  Total Time spent with patient: 15 minutes  Psychiatric Specialty Exam: Physical Exam  Nursing note and vitals reviewed. Constitutional: He is oriented to person, place, and time. He appears well-developed and well-nourished.  Cardiovascular: Normal rate.  Respiratory: Effort normal.  Neurological: He is alert and oriented to person, place, and time.   Review of Systems  Constitutional: Negative.   Respiratory: Negative for cough and shortness of breath.   Gastrointestinal: Negative for nausea and vomiting.  Neurological: Negative for headaches.  Psychiatric/Behavioral: Positive for agitation, dysphoric mood and hallucinations. The patient is nervous/anxious.    Blood pressure (!) 127/92, pulse 97, temperature 98.5 F (36.9 C), temperature source Oral, resp. rate 18, SpO2 98 %.There is no height or weight on file to calculate BMI. General Appearance: Disheveled Eye Contact:  Fair Speech:  Normal Rate Volume:  Increased Mood:  Irritable Affect:  Congruent Thought Process:  Coherent Orientation:  Full (Time, Place, and Person) Thought Content:  Paranoid Ideation Suicidal Thoughts:  No Homicidal Thoughts:  No Memory:  Immediate;   Fair Recent;   Fair Judgement:  Impaired Insight:  Fair Psychomotor Activity:  Increased Concentration: Concentration: Fair and Attention Span: Fair Recall:  [employer] of Knowledge:Fair Language: Fair Akathisia:  No Handed:  Right AIMS (if indicated):    Assets:  Communication Skills Desire for  Improvement Financial Resources/Insurance Sleep:     Musculoskeletal: Strength & Muscle Tone: within normal limits Gait & Station: normal Patient leans: N/A  Blood pressure (!) 127/92, pulse 97, temperature 98.5 F (36.9 C), temperature source Oral, resp. rate 18, SpO2 98 %.  Recommendations: Based on my evaluation the patient does not appear to have an emergency medical condition.  Inpatient hospitalization.  Aldean Baker, NP 04/12/2020, 11:26 AM
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
## 2020-04-12 NOTE — Progress Notes (Addendum)
Patient ID: Thomas Woodward, male   DOB: August 10, 1976, 44 y.o.   MRN: [id number]  Pt alert and oriented on the unit. Pt presented voluntary to OBS wanting his medication adjusted. Pt denies SI/HI, A/VH. Pt is cooperative but is agitated and irritable. Skin assessment complete and unremarkable. Pt stated that he and his wife tested positive coronavirus on Mar 20, 2020. Lucianne Muss, MD and Denice Bors, NP notified. COVID test complete and awaiting results. Education, support and encouragement provided, q15 minute safety checks initiated. Pt denies any concerns at this time, and verbally contracts for safety. Pt ambulating on the unit with no issues. Pt remains safe on the unit.

## 2020-04-13 ENCOUNTER — Observation Stay (HOSPITAL_COMMUNITY): Payer: 59

## 2020-04-13 DIAGNOSIS — F319 Bipolar disorder, unspecified: Secondary | ICD-10-CM | POA: Diagnosis not present

## 2020-04-13 DIAGNOSIS — E785 Hyperlipidemia, unspecified: Secondary | ICD-10-CM | POA: Diagnosis not present

## 2020-04-13 DIAGNOSIS — K219 Gastro-esophageal reflux disease without esophagitis: Secondary | ICD-10-CM | POA: Diagnosis not present

## 2020-04-13 DIAGNOSIS — R05 Cough: Secondary | ICD-10-CM | POA: Diagnosis not present

## 2020-04-13 DIAGNOSIS — G4733 Obstructive sleep apnea (adult) (pediatric): Secondary | ICD-10-CM | POA: Diagnosis not present

## 2020-04-13 DIAGNOSIS — Z87891 Personal history of nicotine dependence: Secondary | ICD-10-CM | POA: Diagnosis not present

## 2020-04-13 DIAGNOSIS — Z79899 Other long term (current) drug therapy: Secondary | ICD-10-CM | POA: Diagnosis not present

## 2020-04-13 NOTE — Progress Notes (Signed)
Pt A & O X4. Visible in his room with his mask on on initial approach. Denies thoughts of self harm, HI, pain and AVH when assessed "I'm still just depressed, thinking about all this medical bills, my insurance is going to shoot up. Pt pending transport to [facility] as instructed per Dr. Lucianne Muss related to his positive Covid Test on 04/12/20 and continuous bouts of coughing episodes since last night. Vitals done and documented. Report called to [facility] charge nurse Kennyth Arnold, RN. Pt remains medication compliant. Denies adverse drug reactions thus far.  Emotional support and encouragement offered to pt throughout this shift. Q 15 minutes safety checks maintained without incident to note thus far.  Pt cooperative with care. Safety maintained.

## 2020-04-13 NOTE — ED Triage Notes (Signed)
Coming from [facility] positive for Covid on 5/17-tested yesterday which resulted positive, sent here for cough, no SOB

## 2020-04-13 NOTE — Progress Notes (Signed)
Patient ID: Thomas Woodward, male   DOB: 1976-05-07, 44 y.o.   MRN: [id number] 04/13/2020 at 9,15 AM  [facility] Observation Unit Progress Note   44 year old male. Lives with wife. Employed . No children.  Presented to [facility]  voluntarily on 6/9 reporting feeling increasingly agitated and having " rage episodes". He reports he has a history of Bipolar Disorder .  On initial assessment yesterday was easily agitated and posturing towards staff during assessment. In OBS unit has been isolative to room, without episodes of overt agitation or threatening behaviors Today presents irritable but not threatening or psycho-motorically agitated at this time. He states he is feeling better. He does present vaguely irritable and loud . He describes he has not been sleeping well, feeling easily irritated and angry, and experiencing feelings of " rage" at times. Acknowledges he has been verbally loud with others but denies any violence towards people. At this time denies suicidal or self injurious ideations and denies homicidal or violent ideations.  As per chart notes he had also endorsed feeling paranoid, with feelings that co-workers were conspiring against him. At this time denies . Denies hallucinations and does not appear internally preoccupied .  He attributes worsening symptoms to having weaned off his psychiatric medications (  Depakote, Zyprexa, Wellbutrin) . Decision to stop medications had to do with concerns about insurance and costs at then time, rather than to side effects. He does report that he did not like how Wellbutrin made him feel. Of note, he reports he had  recently restarted Depakote and Zyprexa.   He is followed by Dr. Lolly Mustache for outpatient psychiatric services but has not followed up recently.   Reports he contracted COVID about 3 weeks ago, at which time he had symptoms to include fever, chills, malaise, cough. He reports these symptoms have improved but endorses persistent cough.   MSE- alert,  attentive, casual, good eye contact, speech becomes loud , pressured at times, no psychomotor agitation or restlessness at this time. Reports he realizes his mood is " angry" . Affect tends to be irritable. No thought disorder is noted and thought process presents linear at this time. No grandiose ideations noted. Denies suicidal or self injurious ideations, denies homicidal or violent ideations at this time. 0x 3.   * I have reviewed with RN and with Dr. Lucianne Muss , who evaluated patient with NP yesterday. As per (+) COVID test and persistent cough transport to [facility] ED has been recommended for appropriate evaluation.   Dx- Bipolar Disorder- Manic  Plan-  Continue Depakote DR 500 mgrs BID, Zyprexa 20 mgrs QHS, and is on agitation protocol for agitation as needed. As above , transport to ED per COVID (+) result and persistent symptoms ( cough)- he reports he contracted COVID about three weeks ago. Patient will be followed by Assunta Found , NP to further determine psychiatric disposition. At this time , based on my evaluation as above, there are no grounds for involuntary commitment .   Sallyanne Havers MD

## 2020-04-13 NOTE — BH Assessment (Signed)
[facility] Assessment Progress Note  Per Shuvon Rankin, FNP, this pt does not require psychiatric hospitalization at this time.  Pt is to be discharged from the [facility] Observation Unit with recommendation to continue treatment with Kathryne Sharper, MD.  This has been included in pt's discharge instructions.  Pt's nurse, Lincoln Maxin, has been notified.  Doylene Canning, MA Triage Specialist [phone]

## 2020-04-13 NOTE — ED Provider Notes (Signed)
[facility]-EMERGENCY DEPT Provider Note   CSN: [id number] Arrival date & time: 04/13/20  1032     History Chief Complaint  Patient presents with  . Cough    [facility] is a 44 y.o. male history of bipolar 1 disorder presenting to emergency department with concern for cough in the setting of Covid diagnosis on 5/17.  Patient was sent to our ED from [facility] after undergoing evaluation for bipolar disorder, at which time he was recommended for inpatient hospitalization.  However it was noted that he could not be placed due to his COVID positive status (diagnosed 5/17) and was admitted to observation unit.  Today, per nurse Daralene Milch note, the patient was transported to [facility] as intstructed by Dr Lucianne Muss due to his covid positive test and continuous bouts of coughing.  The patient tells me he feels "fine," he DENIES having active coughing problems here ("just a little bit, but it's not bad and it's not bothering me.")  He states he was told to come to the Emergency Department because his covid test was positive.  Patient is voluntary for treatment.  He is not under IVC.  HPI     Past Medical History:  Diagnosis Date  . Bipolar 1 disorder (HCC)   . GERD (gastroesophageal reflux disease)   . Hyperlipidemia     Patient Active Problem List   Diagnosis Date Noted  . Bipolar disorder (HCC) 04/12/2020  . OSA (obstructive sleep apnea) 07/01/2018  . Bipolar disorder, unspecified (HCC) 03/11/2012    Past Surgical History:  Procedure Laterality Date  . KNEE SURGERY         Family History  Problem Relation Age of Onset  . Depression Mother   . Alcohol abuse Brother   . Bipolar disorder Brother     Social History   Tobacco Use  . Smoking status: Former Smoker    Packs/day: 0.50    Years: 25.00    Pack years: 12.50    Quit date: 04/26/2013    Years since quitting: 6.9  . Smokeless tobacco: Never Used  Vaping Use  . Vaping Use: Never used    Substance Use Topics  . Alcohol use: No    Alcohol/week: 0.0 standard drinks  . Drug use: No    Home Medications Prior to Admission medications   Medication Sig Start Date End Date Taking? Authorizing Provider  loratadine (CLARITIN) 10 MG tablet Take 10 mg by mouth daily.   Yes [provider]  melatonin 5 MG TABS Take 10 mg by mouth at bedtime as needed.   Yes [provider]  divalproex (DEPAKOTE) 500 MG DR tablet Take 1 tablet (500 mg total) by mouth 2 (two) times daily. 09/21/19   Arfeen, Phillips Grout, MD  Multiple Vitamin (MULTIVITAMIN WITH MINERALS) TABS tablet Take 1 tablet by mouth daily.    [provider]  OLANZapine (ZYPREXA) 10 MG tablet Take 20 mg by mouth at bedtime. 03/28/20   [provider]  omega-3 acid ethyl esters (LOVAZA) 1 G capsule Take 1 g by mouth daily.     [provider]  omeprazole (PRILOSEC) 20 MG capsule Take 20 mg by mouth daily.    [provider]  vitamin C (ASCORBIC ACID) 500 MG tablet Take 1,000 mg by mouth daily.    [provider]    Allergies    Nsaids  Review of Systems   Review of Systems  Constitutional: Negative for chills and fever.  HENT: Negative for ear pain and sore throat.   Eyes: Negative for photophobia and visual disturbance.  Respiratory: Positive for cough. Negative for shortness of breath.   Cardiovascular: Negative for chest pain and palpitations.  Gastrointestinal: Negative for abdominal pain and vomiting.  Genitourinary: Negative for dysuria and hematuria.  Musculoskeletal: Negative for arthralgias and back pain.  Skin: Negative for color change and rash.  Neurological: Negative for syncope and headaches.  All other systems reviewed and are negative.   Physical Exam Updated Vital Signs BP 128/87 (BP Location: Right Arm)   Pulse 76   Temp 98.6 F (37 C) (Oral)   Resp 17   SpO2 98%   Physical Exam Vitals and nursing note reviewed.  Constitutional:       Appearance: He is well-developed.  HENT:     Head: Normocephalic and atraumatic.  Eyes:     Conjunctiva/sclera: Conjunctivae normal.     Pupils: Pupils are equal, round, and reactive to light.  Cardiovascular:     Rate and Rhythm: Normal rate and regular rhythm.  Pulmonary:     Effort: Pulmonary effort is normal. No respiratory distress.     Breath sounds: Normal breath sounds.  Musculoskeletal:     Cervical back: Neck supple.  Skin:    General: Skin is warm and dry.  Neurological:     General: No focal deficit present.     Mental Status: He is alert and oriented to person, place, and time.     ED Results / Procedures / Treatments   Labs (all labs ordered are listed, but only abnormal results are displayed) Labs Reviewed  SARS CORONAVIRUS 2 BY RT PCR ([place] [place] [facility]) - Abnormal; Notable for the following components:      Result Value   SARS Coronavirus 2 POSITIVE (*)    All other components within normal limits  COMPREHENSIVE METABOLIC PANEL - Abnormal; Notable for the following components:   Sodium 148 (*)    Glucose, Bld 118 (*)    AST 52 (*)    All other components within normal limits  HEMOGLOBIN A1C - Abnormal; Notable for the following components:   Hgb A1c MFr Bld 5.7 (*)    All other components within normal limits  VALPROIC ACID LEVEL - Abnormal; Notable for the following components:   Valproic Acid Lvl 43 (*)    All other components within normal limits  ETHANOL  TSH  CBC WITH DIFFERENTIAL/PLATELET    EKG None  Radiology DG Chest Portable 1 View  Result Date: 04/13/2020 CLINICAL DATA:  Cough, history of COVID. EXAM: PORTABLE CHEST 1 VIEW COMPARISON:  10/06/2019 FINDINGS: The heart size and mediastinal contours are within normal limits. Both lungs are clear. The visualized skeletal structures are unremarkable. IMPRESSION: Normal study. Electronically Signed   By: Rolm Baptise M.D.   On: 04/13/2020 12:17     Procedures Procedures (including critical care time)  Medications Ordered in ED Medications - No data to display  ED Course  I have reviewed the triage vital signs and the nursing notes.  Pertinent labs & imaging results that were available during my care of the patient were reviewed by me and considered in my medical decision making (see chart for details).  44 yo male referred to [facility] from [facility] today with reported concern for coughing in setting of COVID diagnosis last month.  The patient has no acute complaints.  His vitals are excellent.  His labs from yesterday show no  leukocytosis, no SIRS criteria.  The patient feels well, does not think his coughing is an issue, and has no other complaints.  It is not clear to me at all why this patient was referred to [place] for an emergency evaluation.  There is no further note or clarification from Dr Lucianne Muss or psych MD.  I did clarify by phone with [facility] that he was felt to be medically stable for outpatient management by the behavioral health team and psych team.  He is not under IVC and appears to be mentally stable, cognizant, and reasonable to me.    Final Clinical Impression(s) / ED Diagnoses Final diagnoses:  Cough  COVID-19    Rx / DC Orders ED Discharge Orders    None       Zyliah Schier, Kermit Balo, MD 04/13/20 1815

## 2020-04-13 NOTE — Progress Notes (Signed)
DAR NOTE: Pt present with flat affect and depressed mood in the unit. Pt has been isolating himself and has been bed most of the time. Pt denies physical pain, took all his meds as scheduled.  Pt's safety ensured with 15 minute and environmental checks. Pt currently denies SI/HI and A/V hallucinations. Pt verbally agrees to seek staff if SI/HI or A/VH occurs and to consult with staff before acting on these thoughts. Will continue POC.  

## 2020-04-13 NOTE — Discharge Instructions (Addendum)
For your behavioral health needs, you are advised to continue treatment with Kathryne Sharper, MD.  If you feel that you need to change providers, you are advised to discuss this with your current provider.       [facility]. [employer]      [place], [place] [address]      [phone]

## 2021-10-03 IMAGING — DX DG CHEST 1V PORT
1 series · 2 of 2 positions shown · non-contrast
Comparison: 10/06/2019

CLINICAL DATA: Cough, history of COVID.

EXAM:
PORTABLE CHEST 1 VIEW

[Series 1: chest ap · 0.14mm/px · 2 of 2 slices shown]
[im 1/2]
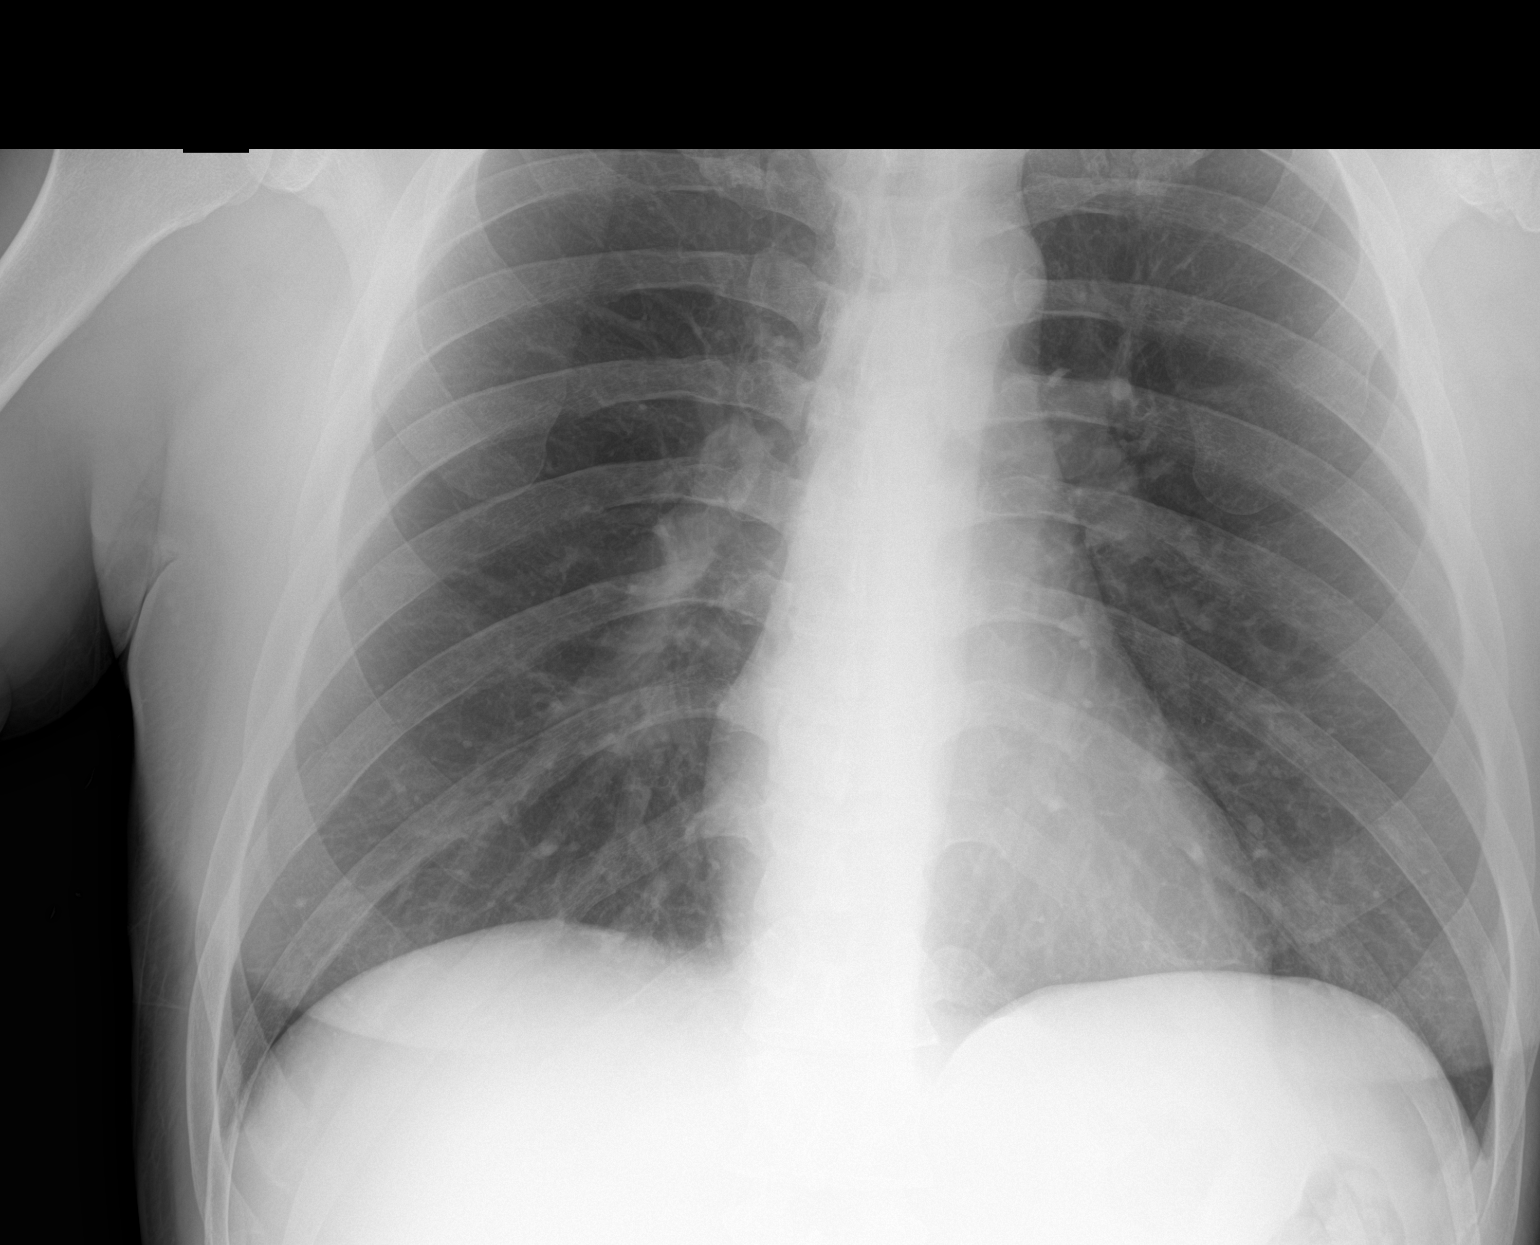
[im 2/2]
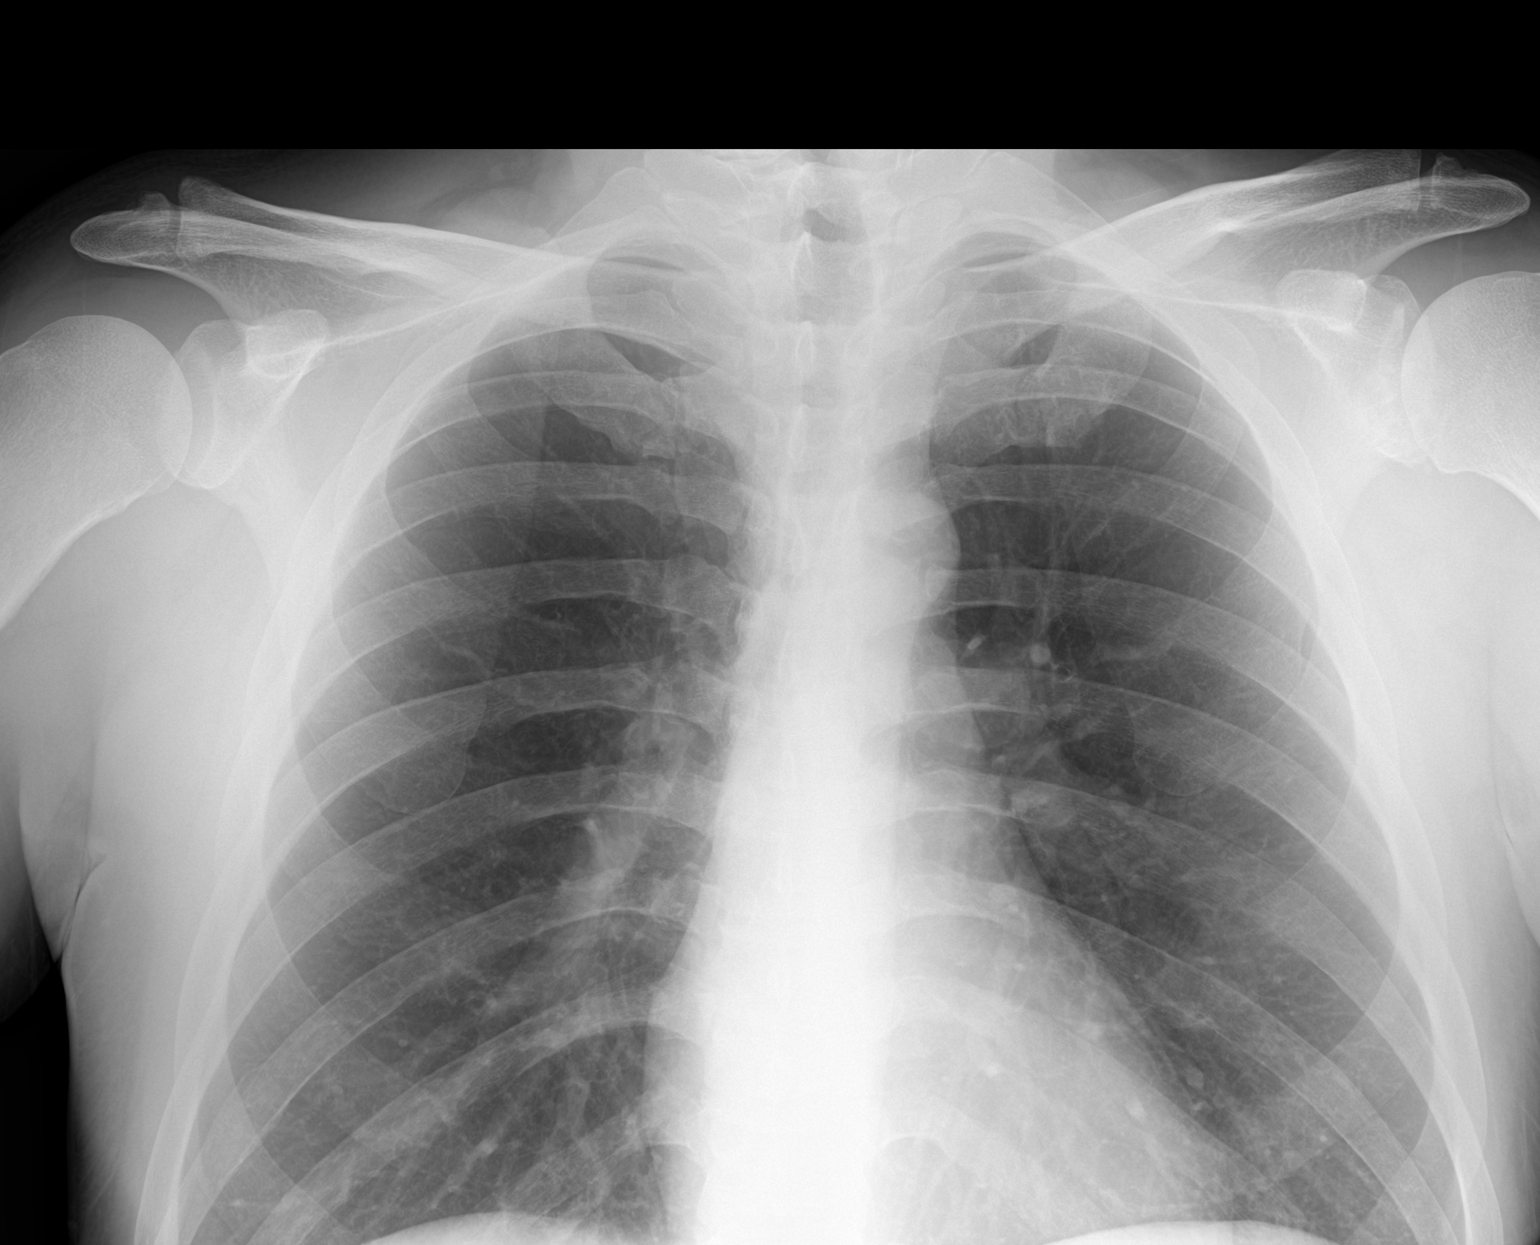

[2 of 2 positions shown; findings below may reference images not displayed]

FINDINGS: The heart size and mediastinal contours are within normal limits.
Both lungs are clear. The visualized skeletal structures are
unremarkable.
IMPRESSION: Normal study.
